# Patient Record
Sex: Male | Born: 1965 | Race: White | Hispanic: No | Marital: Married | Smoking: Never smoker
Health system: Southern US, Community
[De-identification: ages and names within clinical notes are randomized; demographics above are authoritative.]

## PROBLEM LIST (undated history)

## (undated) DIAGNOSIS — E785 Hyperlipidemia, unspecified: Secondary | ICD-10-CM

## (undated) DIAGNOSIS — J45909 Unspecified asthma, uncomplicated: Secondary | ICD-10-CM

## (undated) DIAGNOSIS — I10 Essential (primary) hypertension: Secondary | ICD-10-CM

## (undated) HISTORY — PX: MOUTH SURGERY: SHX715

## (undated) HISTORY — DX: Essential (primary) hypertension: I10

## (undated) HISTORY — DX: Hyperlipidemia, unspecified: E78.5

## (undated) HISTORY — DX: Unspecified asthma, uncomplicated: J45.909

---

## 2005-11-25 ENCOUNTER — Ambulatory Visit: Payer: Self-pay | Admitting: Internal Medicine

## 2005-12-07 ENCOUNTER — Ambulatory Visit: Payer: Self-pay

## 2005-12-08 ENCOUNTER — Ambulatory Visit: Payer: Self-pay | Admitting: Internal Medicine

## 2007-05-09 ENCOUNTER — Encounter: Payer: Self-pay | Admitting: Internal Medicine

## 2007-05-11 ENCOUNTER — Ambulatory Visit: Payer: Self-pay | Admitting: Internal Medicine

## 2007-05-11 DIAGNOSIS — R930 Abnormal findings on diagnostic imaging of skull and head, not elsewhere classified: Secondary | ICD-10-CM | POA: Insufficient documentation

## 2007-05-11 DIAGNOSIS — J45909 Unspecified asthma, uncomplicated: Secondary | ICD-10-CM | POA: Insufficient documentation

## 2007-05-12 ENCOUNTER — Encounter: Payer: Self-pay | Admitting: Internal Medicine

## 2007-05-12 LAB — CONVERTED CEMR LAB
ALT: 118 units/L — ABNORMAL HIGH (ref 0–53)
BUN: 15 mg/dL (ref 6–23)
Basophils Absolute: 0 10*3/uL (ref 0.0–0.1)
Bilirubin, Direct: 0.2 mg/dL (ref 0.0–0.3)
Creatinine, Ser: 1 mg/dL (ref 0.4–1.5)
Eosinophils Absolute: 0.1 10*3/uL (ref 0.0–0.6)
Eosinophils Relative: 1.3 % (ref 0.0–5.0)
HCT: 48 % (ref 39.0–52.0)
Hemoglobin: 17.2 g/dL — ABNORMAL HIGH (ref 13.0–17.0)
Lymphocytes Relative: 27.9 % (ref 12.0–46.0)
MCHC: 35.9 g/dL (ref 30.0–36.0)
MCV: 91.5 fL (ref 78.0–100.0)
Monocytes Absolute: 0.9 10*3/uL — ABNORMAL HIGH (ref 0.2–0.7)
Neutro Abs: 3.8 10*3/uL (ref 1.4–7.7)
Neutrophils Relative %: 57.3 % (ref 43.0–77.0)
Potassium: 3.1 meq/L — ABNORMAL LOW (ref 3.5–5.1)
Total Protein: 6.9 g/dL (ref 6.0–8.3)
WBC: 6.7 10*3/uL (ref 4.5–10.5)

## 2007-05-15 ENCOUNTER — Encounter (INDEPENDENT_AMBULATORY_CARE_PROVIDER_SITE_OTHER): Payer: Self-pay | Admitting: *Deleted

## 2007-05-17 ENCOUNTER — Encounter: Admission: RE | Admit: 2007-05-17 | Discharge: 2007-05-17 | Payer: Self-pay | Admitting: Internal Medicine

## 2007-05-19 ENCOUNTER — Encounter: Payer: Self-pay | Admitting: Internal Medicine

## 2007-05-22 ENCOUNTER — Encounter (INDEPENDENT_AMBULATORY_CARE_PROVIDER_SITE_OTHER): Payer: Self-pay | Admitting: *Deleted

## 2007-05-23 ENCOUNTER — Ambulatory Visit: Payer: Self-pay | Admitting: Cardiology

## 2007-05-25 ENCOUNTER — Encounter (INDEPENDENT_AMBULATORY_CARE_PROVIDER_SITE_OTHER): Payer: Self-pay | Admitting: *Deleted

## 2007-05-29 ENCOUNTER — Telehealth (INDEPENDENT_AMBULATORY_CARE_PROVIDER_SITE_OTHER): Payer: Self-pay | Admitting: *Deleted

## 2007-06-04 ENCOUNTER — Ambulatory Visit: Payer: Self-pay | Admitting: Internal Medicine

## 2007-06-04 DIAGNOSIS — E782 Mixed hyperlipidemia: Secondary | ICD-10-CM | POA: Insufficient documentation

## 2007-06-04 DIAGNOSIS — I10 Essential (primary) hypertension: Secondary | ICD-10-CM

## 2007-06-04 DIAGNOSIS — R740 Nonspecific elevation of levels of transaminase and lactic acid dehydrogenase [LDH]: Secondary | ICD-10-CM

## 2007-10-02 ENCOUNTER — Ambulatory Visit: Payer: Self-pay | Admitting: Internal Medicine

## 2007-10-08 LAB — CONVERTED CEMR LAB
ALT: 76 units/L — ABNORMAL HIGH (ref 0–53)
AST: 34 units/L (ref 0–37)
Potassium: 3.8 meq/L (ref 3.5–5.1)
Total Bilirubin: 0.8 mg/dL (ref 0.3–1.2)
Total Protein: 6.8 g/dL (ref 6.0–8.3)

## 2007-10-09 ENCOUNTER — Ambulatory Visit: Payer: Self-pay | Admitting: Internal Medicine

## 2008-01-31 ENCOUNTER — Ambulatory Visit: Payer: Self-pay | Admitting: Internal Medicine

## 2008-02-04 ENCOUNTER — Encounter (INDEPENDENT_AMBULATORY_CARE_PROVIDER_SITE_OTHER): Payer: Self-pay | Admitting: *Deleted

## 2008-02-04 LAB — CONVERTED CEMR LAB
AST: 36 units/L (ref 0–37)
Bilirubin, Direct: 0.1 mg/dL (ref 0.0–0.3)
Direct LDL: 76.4 mg/dL
Total Bilirubin: 0.9 mg/dL (ref 0.3–1.2)
Total CHOL/HDL Ratio: 5.3
Triglycerides: 209 mg/dL (ref 0–149)

## 2008-02-07 ENCOUNTER — Ambulatory Visit: Payer: Self-pay | Admitting: Internal Medicine

## 2008-02-19 ENCOUNTER — Telehealth (INDEPENDENT_AMBULATORY_CARE_PROVIDER_SITE_OTHER): Payer: Self-pay | Admitting: *Deleted

## 2008-06-02 ENCOUNTER — Ambulatory Visit: Payer: Self-pay | Admitting: Internal Medicine

## 2008-06-07 LAB — CONVERTED CEMR LAB
Albumin: 4.1 g/dL (ref 3.5–5.2)
Cholesterol: 164 mg/dL (ref 0–200)
HDL: 29.1 mg/dL — ABNORMAL LOW (ref >39.0)
Total CHOL/HDL Ratio: 5.6
Total Protein: 6.7 g/dL (ref 6.0–8.3)
VLDL: 43 mg/dL — ABNORMAL HIGH (ref 0–40)

## 2008-06-09 ENCOUNTER — Ambulatory Visit: Payer: Self-pay | Admitting: Internal Medicine

## 2008-06-09 DIAGNOSIS — K219 Gastro-esophageal reflux disease without esophagitis: Secondary | ICD-10-CM

## 2008-07-14 ENCOUNTER — Ambulatory Visit: Payer: Self-pay | Admitting: Family Medicine

## 2008-08-07 ENCOUNTER — Ambulatory Visit: Payer: Self-pay | Admitting: Internal Medicine

## 2008-08-11 ENCOUNTER — Ambulatory Visit: Payer: Self-pay | Admitting: Internal Medicine

## 2008-08-12 ENCOUNTER — Telehealth (INDEPENDENT_AMBULATORY_CARE_PROVIDER_SITE_OTHER): Payer: Self-pay | Admitting: *Deleted

## 2008-08-12 ENCOUNTER — Encounter (INDEPENDENT_AMBULATORY_CARE_PROVIDER_SITE_OTHER): Payer: Self-pay | Admitting: *Deleted

## 2008-10-09 ENCOUNTER — Ambulatory Visit: Payer: Self-pay | Admitting: Internal Medicine

## 2008-10-12 LAB — CONVERTED CEMR LAB
Bilirubin, Direct: 0.1 mg/dL (ref 0.0–0.3)
Total Bilirubin: 1 mg/dL (ref 0.3–1.2)

## 2008-10-14 ENCOUNTER — Encounter: Payer: Self-pay | Admitting: Internal Medicine

## 2008-10-14 ENCOUNTER — Telehealth (INDEPENDENT_AMBULATORY_CARE_PROVIDER_SITE_OTHER): Payer: Self-pay | Admitting: *Deleted

## 2008-10-23 ENCOUNTER — Encounter (INDEPENDENT_AMBULATORY_CARE_PROVIDER_SITE_OTHER): Payer: Self-pay | Admitting: *Deleted

## 2008-10-29 ENCOUNTER — Ambulatory Visit: Payer: Self-pay | Admitting: Internal Medicine

## 2008-10-29 DIAGNOSIS — E8881 Metabolic syndrome: Secondary | ICD-10-CM

## 2008-12-10 ENCOUNTER — Ambulatory Visit: Payer: Self-pay | Admitting: Internal Medicine

## 2008-12-25 ENCOUNTER — Ambulatory Visit: Payer: Self-pay | Admitting: Internal Medicine

## 2009-01-15 ENCOUNTER — Telehealth (INDEPENDENT_AMBULATORY_CARE_PROVIDER_SITE_OTHER): Payer: Self-pay | Admitting: *Deleted

## 2009-03-13 ENCOUNTER — Ambulatory Visit: Payer: Self-pay | Admitting: Internal Medicine

## 2009-03-15 LAB — CONVERTED CEMR LAB
Albumin: 4.3 g/dL (ref 3.5–5.2)
Cholesterol: 162 mg/dL (ref 0–200)
Direct LDL: 96.1 mg/dL
HDL: 31.1 mg/dL — ABNORMAL LOW (ref >39.00)
Hgb A1c MFr Bld: 5.2 % (ref 4.6–6.5)
Total CHOL/HDL Ratio: 5
Triglycerides: 203 mg/dL — ABNORMAL HIGH (ref 0.0–149.0)
VLDL: 40.6 mg/dL — ABNORMAL HIGH (ref 0.0–40.0)

## 2009-03-16 ENCOUNTER — Encounter (INDEPENDENT_AMBULATORY_CARE_PROVIDER_SITE_OTHER): Payer: Self-pay | Admitting: *Deleted

## 2009-03-28 IMAGING — CT CT CHEST W/O CM
2 of 4 series · 15 of 36 positions shown, 18 images · non-contrast
Comparison: Chest x-ray from 05/17/2007

CLINICAL DATA: Question right lung nodule on recent chest x-ray
TECHNIQUE: Routine uninfused CT scanning of the chest was performed at 5mm
collimation.

[Series 2: chest_hi_res 5.0 b40f st · axial · 0.87mm/px · z∈[-318,-44]mm · 12 of 67 slices shown, 15 images]
[im 6/67  mediastinal]
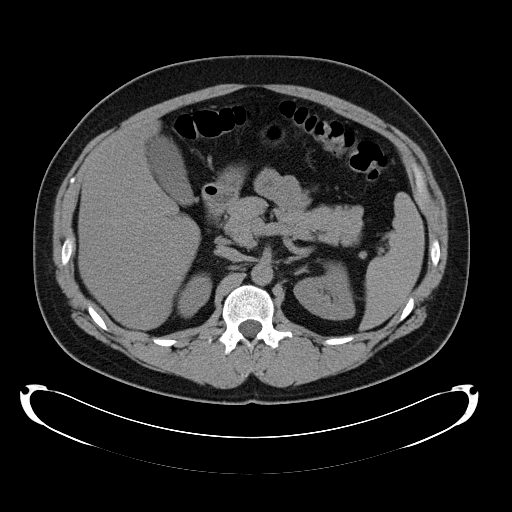
[im 6/67  lung]
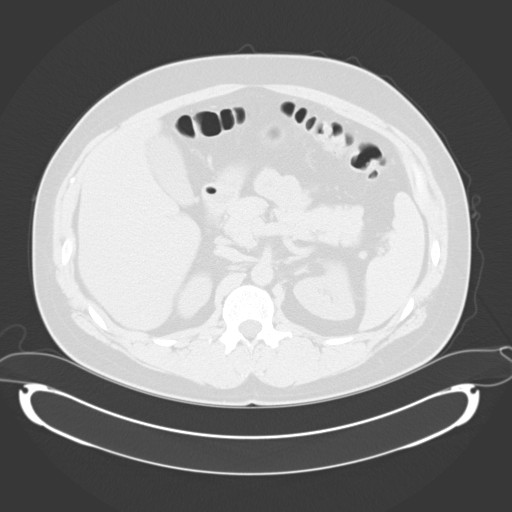
[im 11/67  lung]
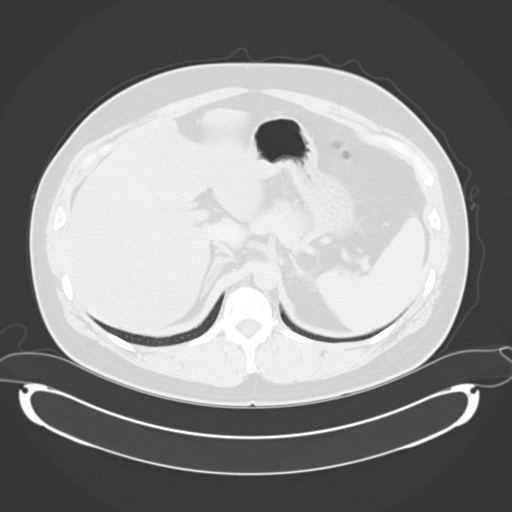
[im 16/67  lung]
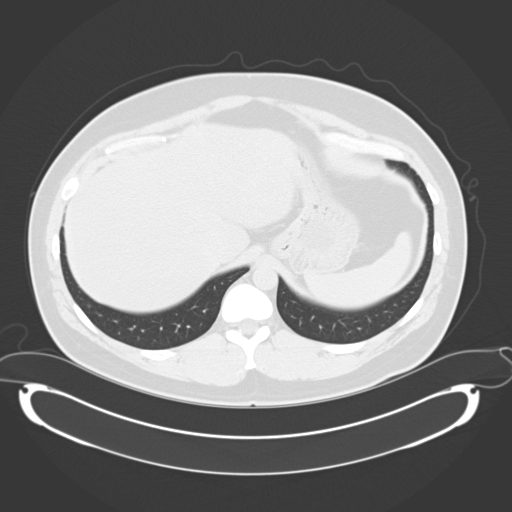
[im 21/67  lung]
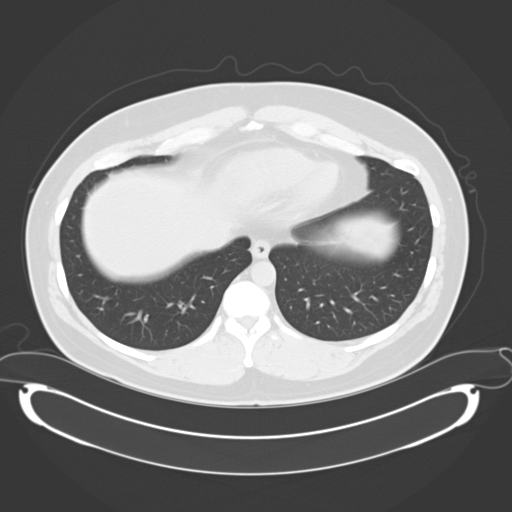
[im 26/67  mediastinal]
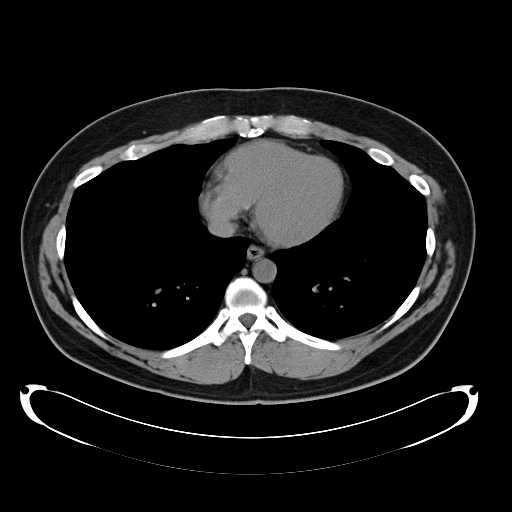
[im 26/67  lung]
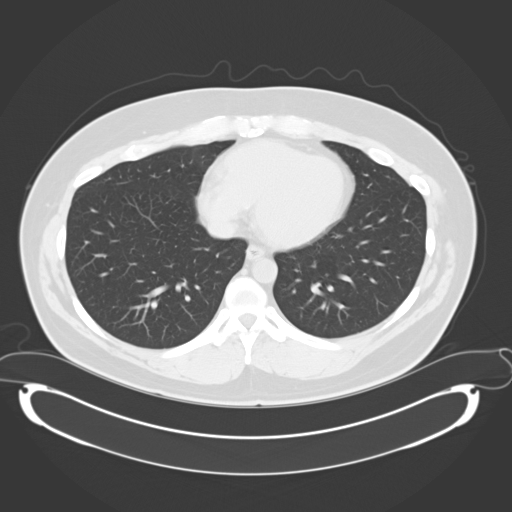
[im 31/67  lung]
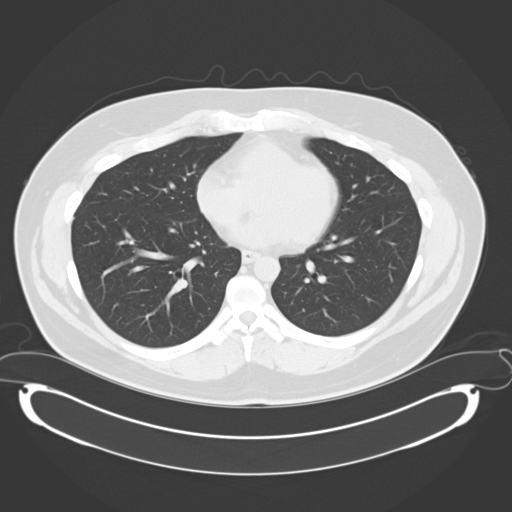
[im 36/67  lung]
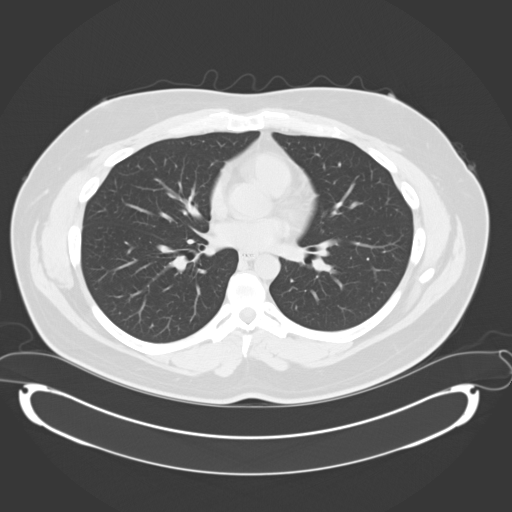
[im 41/67  lung]
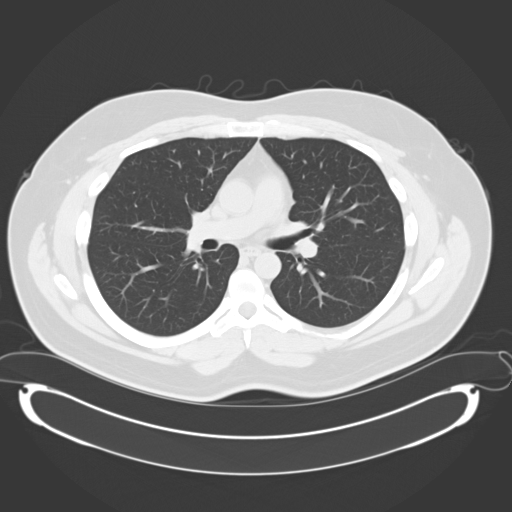
[im 46/67  mediastinal]
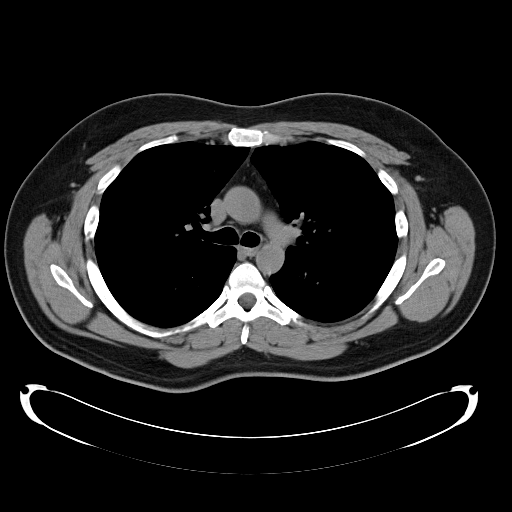
[im 46/67  lung]
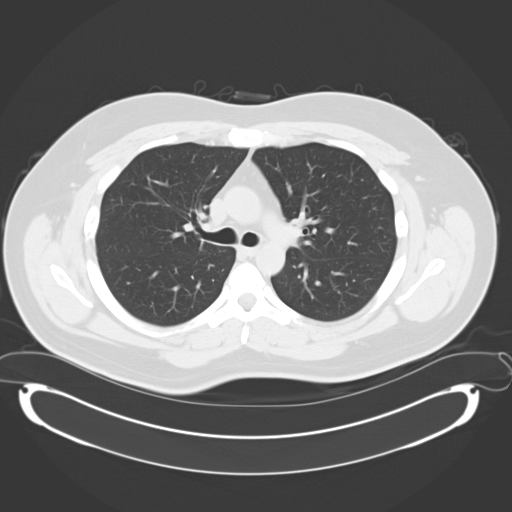
[im 51/67  lung]
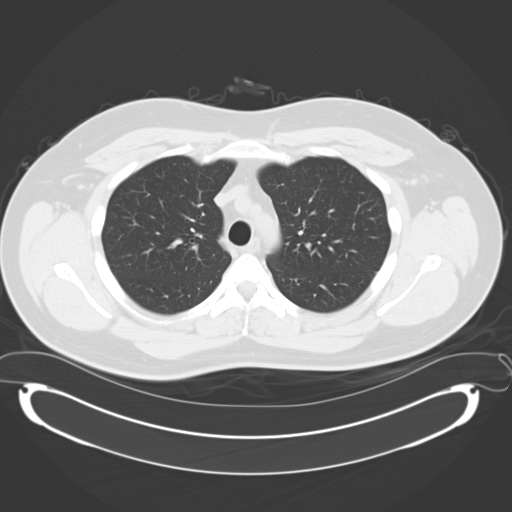
[im 56/67  lung]
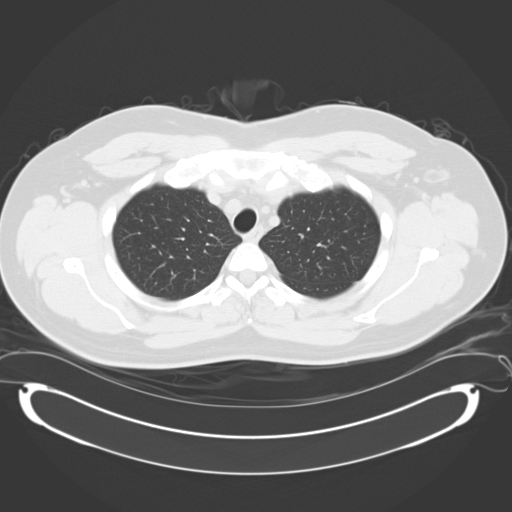
[im 61/67  lung]
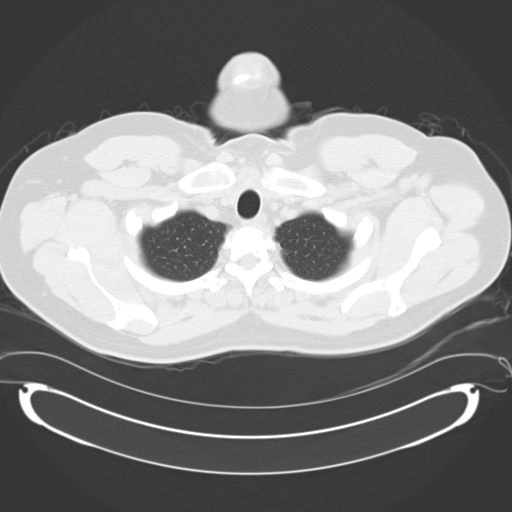

[Series 602: <mpr thick range> · coronal · 0.87mm/px · 3 of 87 slices shown]
[im 18/87  lung]
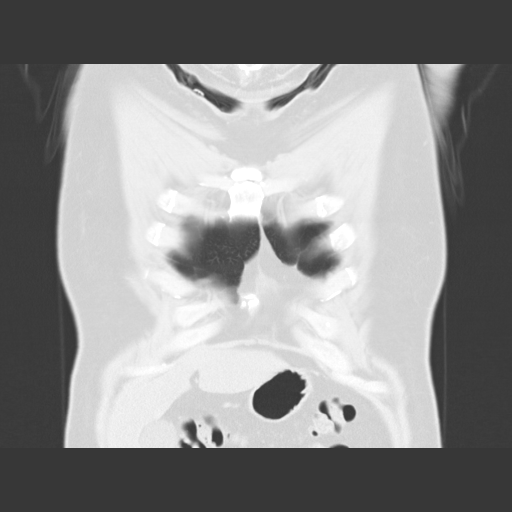
[im 35/87  lung]
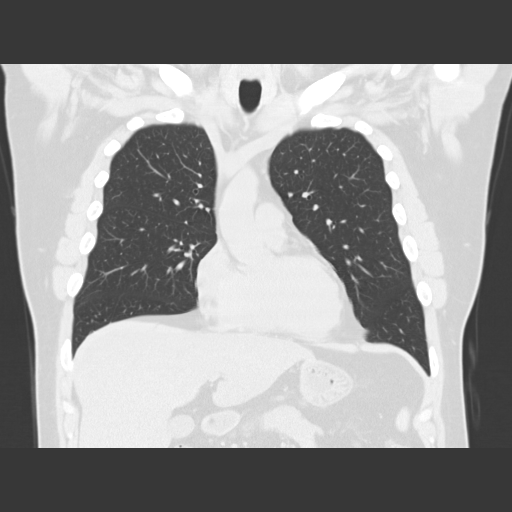
[im 52/87  lung]
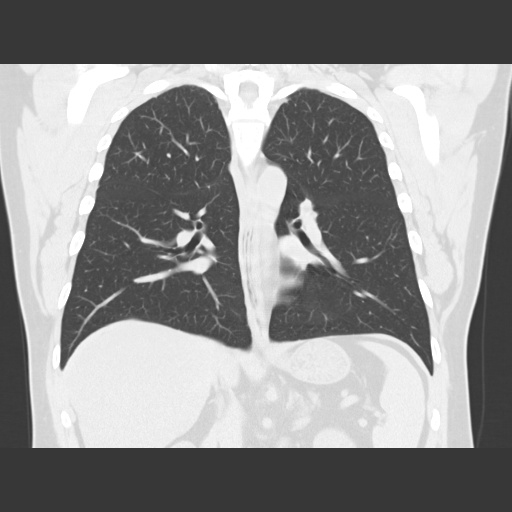

[15 of 36 positions shown; findings below may reference images not displayed]

CHEST CT WITHOUT CONTRAST:

There is no axillary, mediastinal, or hilar lymphadenopathy. Heart is normal. No
pericardial or pleural effusion. The lungs are clear bilaterally. Specifically,
there is no evidence for a nodule in the infrahilar region of the right lung.

Imaging of the upper abdomen show some fatty infiltration of the liver.
IMPRESSION: Unremarkable CT examination of the chest.

## 2010-05-19 ENCOUNTER — Encounter: Payer: Self-pay | Admitting: Internal Medicine

## 2010-05-19 ENCOUNTER — Ambulatory Visit
Admission: RE | Admit: 2010-05-19 | Discharge: 2010-05-19 | Payer: Self-pay | Source: Home / Self Care | Attending: Internal Medicine | Admitting: Internal Medicine

## 2010-05-19 DIAGNOSIS — M545 Low back pain: Secondary | ICD-10-CM | POA: Insufficient documentation

## 2010-05-27 NOTE — Assessment & Plan Note (Signed)
Summary: med refill//lch   Vital Signs:  Patient profile:   45 year old male Height:      70.25 inches Weight:      236.6 pounds BMI:     33.83 Temp:     98.2 degrees F oral Pulse rate:   72 / minute Resp:     16 per minute BP sitting:   140 / 98  (left arm) Cuff size:   large  Vitals Entered By: Shonna Chock CMA (May 19, 2010 2:08 PM) CC: Yearly CPX and refill meds , Back pain, Lipid Management   CC:  Yearly CPX and refill meds , Back pain, and Lipid Management.  History of Present Illness:    Dustin Wang  ([name]) is here for a physical; he has intermittent low back pain since fall on ice  in 2010. The pain is sharp & nonradiating.  The patient denies associated  weakness, loss of sensation, fecal incontinence, urinary incontinence, and urinary retention.   The pain is made worse by "plopping " onto hard surface.     Hypertension Follow-Up:  The patient denies lightheadedness, urinary frequency, headaches, edema, and fatigue.  The patient denies the following associated symptoms: chest pain, chest pressure, exercise intolerance, dyspnea, palpitations, and syncope.  Compliance with medications (by patient report) has been near 100%.  The patient reports that dietary compliance has been fair.  The patient reports exercising occasionally.  Adjunctive measures currently used by the patient include salt restriction.  BP not monitored.  Lipid Management History:      Positive NCEP/ATP III risk factors include HDL cholesterol less than 40 and hypertension.  Negative NCEP/ATP III risk factors include male age less than 57 years old, non-diabetic, no family history for ischemic heart disease, non-tobacco-user status, no ASHD (atherosclerotic heart disease), no prior stroke/TIA, no peripheral vascular disease, and no history of aortic aneurysm.     Preventive Screening-Counseling & Management  Alcohol-Tobacco     Smoking Status: never  Current Medications (verified): 1)  Vit B 2)  Fish Oil  Qd 3)  Vit C Qd 4)  Diltiazem Hcl Coated Beads 120 Mg  Cp24 (Diltiazem Hcl Coated Beads) .Marland Kitchen.. 1 By Mouth Once Daily **appointment Due** 5)  Buspirone Hcl 10 Mg Tabs (Buspirone Hcl) .... 1/2 By Mouth Once Daily 6)  Vitamin D 1000 Unit Tabs (Cholecalciferol) .Marland Kitchen.. 1 By Mouth Once Daily 7)  Metrogel 1 % Gel (Metronidazole) .... Apply Once Daily After Cleansing  Allergies: 1)  ! Pcn  Past History:  Past Medical History: Hypertension Hyperlipidemia: Framingham Study LDL = < 130. 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; Metabolic Syndrome( TG 208, HDL 31, VLDL  42); neg Nuclear Stress Test 2007  Past Surgical History: Oral surgery ([place] Teeth)  Family History: Father: HTN Mother: chronic  bronchitis,HTN Siblings: negative ; MGGM :CVAs; no FH MI No FH PULMONARY  DISEASE  Social History: Occupation: Transport planner Married Never Smoked Alcohol use-yes: rarely  Review of Systems  The patient denies anorexia, fever, vision loss, decreased hearing, hoarseness, prolonged cough, hemoptysis, abdominal pain, melena, hematochezia, severe indigestion/heartburn, hematuria, suspicious skin lesions, depression, unusual weight change, abnormal bleeding, enlarged lymph nodes, and angioedema.         Weight up 5#.  Physical Exam  General:  well-nourished;alert,appropriate and cooperative throughout examination Head:  Normocephalic and atraumatic without obvious abnormalities. No  alopecia  Eyes:  No corneal or conjunctival inflammation noted.Perrla. Funduscopic exam benign, without hemorrhages, exudates or papilledema.  Ears:  External ear exam shows  no significant lesions or deformities.  Otoscopic examination reveals clear canals, tympanic membranes are intact bilaterally without bulging, retraction, inflammation or discharge. Hearing is grossly normal bilaterally. Nose:  External nasal examination shows no deformity or inflammation. Nasal mucosa are pink and moist without lesions or exudates. Mouth:  Oral  mucosa and oropharynx without lesions or exudates.  Teeth in good repair. Neck:  No deformities, masses, or tenderness noted. Lungs:  Normal respiratory effort, chest expands symmetrically. Lungs are clear to auscultation, no crackles or wheezes. Heart:  Normal rate and regular rhythm. S1 and S2 normal without gallop, murmur, click, rub . S4 Abdomen:  Bowel sounds positive,abdomen soft and non-tender without masses, organomegaly or hernias noted. Rectal:  No external abnormalities noted. Normal sphincter tone. No rectal masses or tenderness. Genitalia:  Testes bilaterally descended without nodularity, tenderness or masses. No scrotal masses or lesions. No penis lesions or urethral discharge. L varicocele.   Prostate:  Prostate gland firm and smooth, no enlargement, nodularity, tenderness, mass, asymmetry or induration. Msk:  No deformity or scoliosis noted of thoracic or lumbar spine.   Neg SLR  Pulses:  R and L carotid,radial,dorsalis pedis and posterior tibial pulses are full and equal bilaterally Extremities:  No clubbing, cyanosis, edema, or deformity noted with normal full range of motion of all joints.   Neurologic:  alert & oriented X3, strength normal in all extremities, and DTRs symmetrical and normal.   Skin:  Skin tags in  R axilla Cervical Nodes:  No lymphadenopathy noted Axillary Nodes:  No palpable lymphadenopathy Inguinal Nodes:  No significant adenopathy Psych:  memory intact for recent and remote, normally interactive, and good eye contact.     Impression & Recommendations:  Problem # 1:  ROUTINE GENERAL MEDICAL EXAM@HEALTH  CARE FACL (ICD-V70.0)  Orders: EKG w/ Interpretation (93000)  Problem # 2:  LOW BACK PAIN, CHRONIC (ICD-724.2) sacral injury post fall  Problem # 3:  HYPERTENSION, ESSENTIAL NOS (ICD-401.9)  His updated medication list for this problem includes:    Diltiazem Hcl Coated Beads 120 Mg Cp24 (Diltiazem hcl coated beads) .Marland Kitchen... 1 by mouth once daily     Losartan Potassium 100 Mg Tabs (Losartan potassium) .Marland Kitchen... 1/2 -1 once daily if bp stays > 135/85 on average   Problem # 4:  HYPERLIPIDEMIA (ICD-272.2)  Complete Medication List: 1)  Vit B  2)  Fish Oil Qd  3)  Vit C Qd  4)  Diltiazem Hcl Coated Beads 120 Mg Cp24 (Diltiazem hcl coated beads) .Marland Kitchen.. 1 by mouth once daily 5)  Buspirone Hcl 10 Mg Tabs (Buspirone hcl) .... 1/2 by mouth once daily 6)  Vitamin D 1000 Unit Tabs (Cholecalciferol) .Marland Kitchen.. 1 by mouth once daily 7)  Metrogel 1 % Gel (Metronidazole) .... Apply once daily after cleansing 8)  Losartan Potassium 100 Mg Tabs (Losartan potassium) .... 1/2 -1 once daily if bp stays > 135/85 on average  Lipid Assessment/Plan:      Based on NCEP/ATP III, the patient's risk factor category is "2 or more risk factors and a calculated 10 year CAD risk of > 20%".  The patient's lipid goals are as follows: Total cholesterol goal is 200; LDL cholesterol goal is 160; HDL cholesterol goal is 40; Triglyceride goal is 150.  His LDL cholesterol goal has been met.  Secondary causes for hyperlipidemia have been ruled out.  He has been counseled on adjunctive measures for lowering his cholesterol and has been provided with dietary instructions.    Patient Instructions: 1)  Consume < 40 grams of HFCS sugar / day as discussed.Consider fasting labs; see Diagnoses for Codes: 2)  BMP ; 3)  Hepatic Panel ; 4)  Lipid Panel ; 5)  TSH ; 6)  CBC w/ Diff . 7)  Check your Blood Pressure regularly. If it is above:  135/85 ON AVERAGE you should  add Losartan. Prescriptions: LOSARTAN POTASSIUM 100 MG TABS (LOSARTAN POTASSIUM) 1/2 -1 once daily if BP stays > 135/85 on average  #30 x 5   Entered and Authorized by:   Marga Melnick MD   Signed by:   Marga Melnick MD on 05/19/2010   Method used:   Print then Give to Patient   RxID:   [id number] DILTIAZEM HCL COATED BEADS 120 MG  CP24 (DILTIAZEM HCL COATED BEADS) 1 by mouth once daily  #90 x 3   Entered and Authorized  by:   Marga Melnick MD   Signed by:   Marga Melnick MD on 05/19/2010   Method used:   Print then Give to Patient   RxID:   [id number]    Orders Added: 1)  Est. Patient 40-64 years [99396] 2)  EKG w/ Interpretation [93000]

## 2010-06-17 IMAGING — CR DG CHEST 2V
2 series · 2 of 2 positions shown · non-contrast
Comparison: 05/17/2007

CLINICAL DATA: Cough/left chest pain

CHEST - 2 VIEW

[view not recorded (1 of 2)]
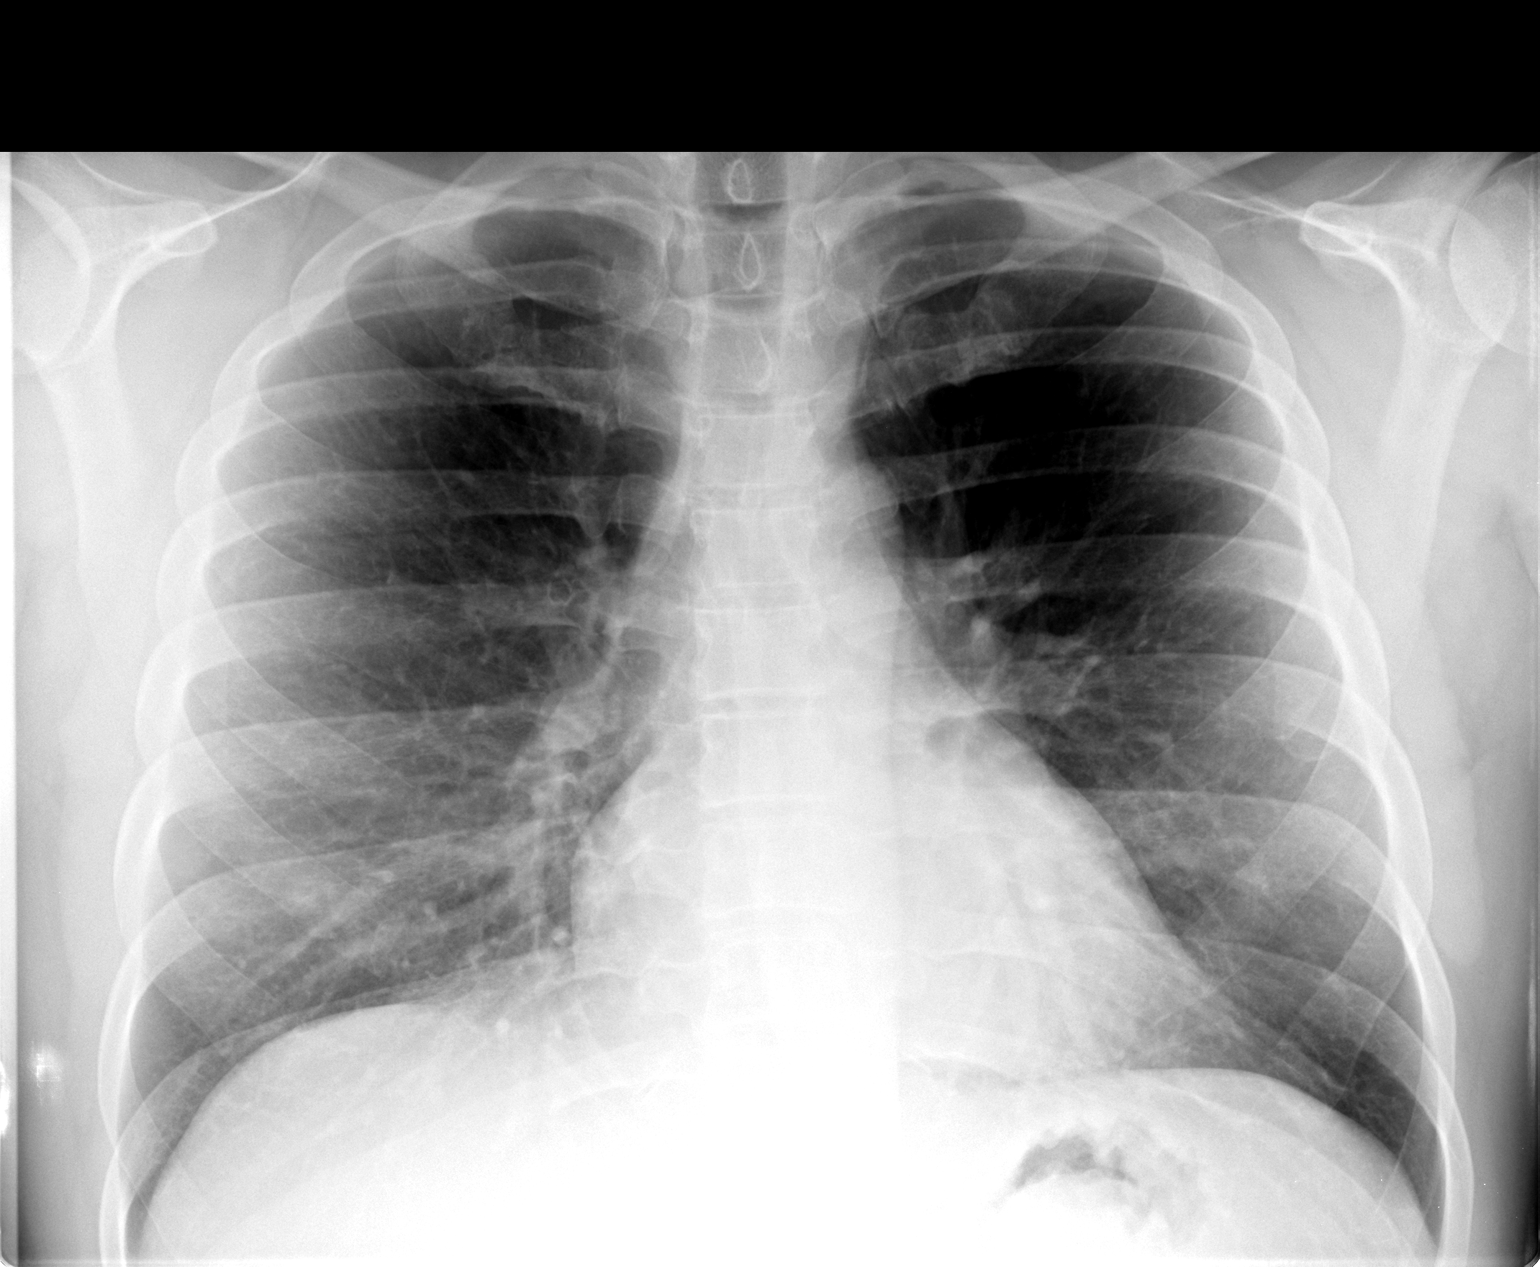

[view not recorded (2 of 2)]
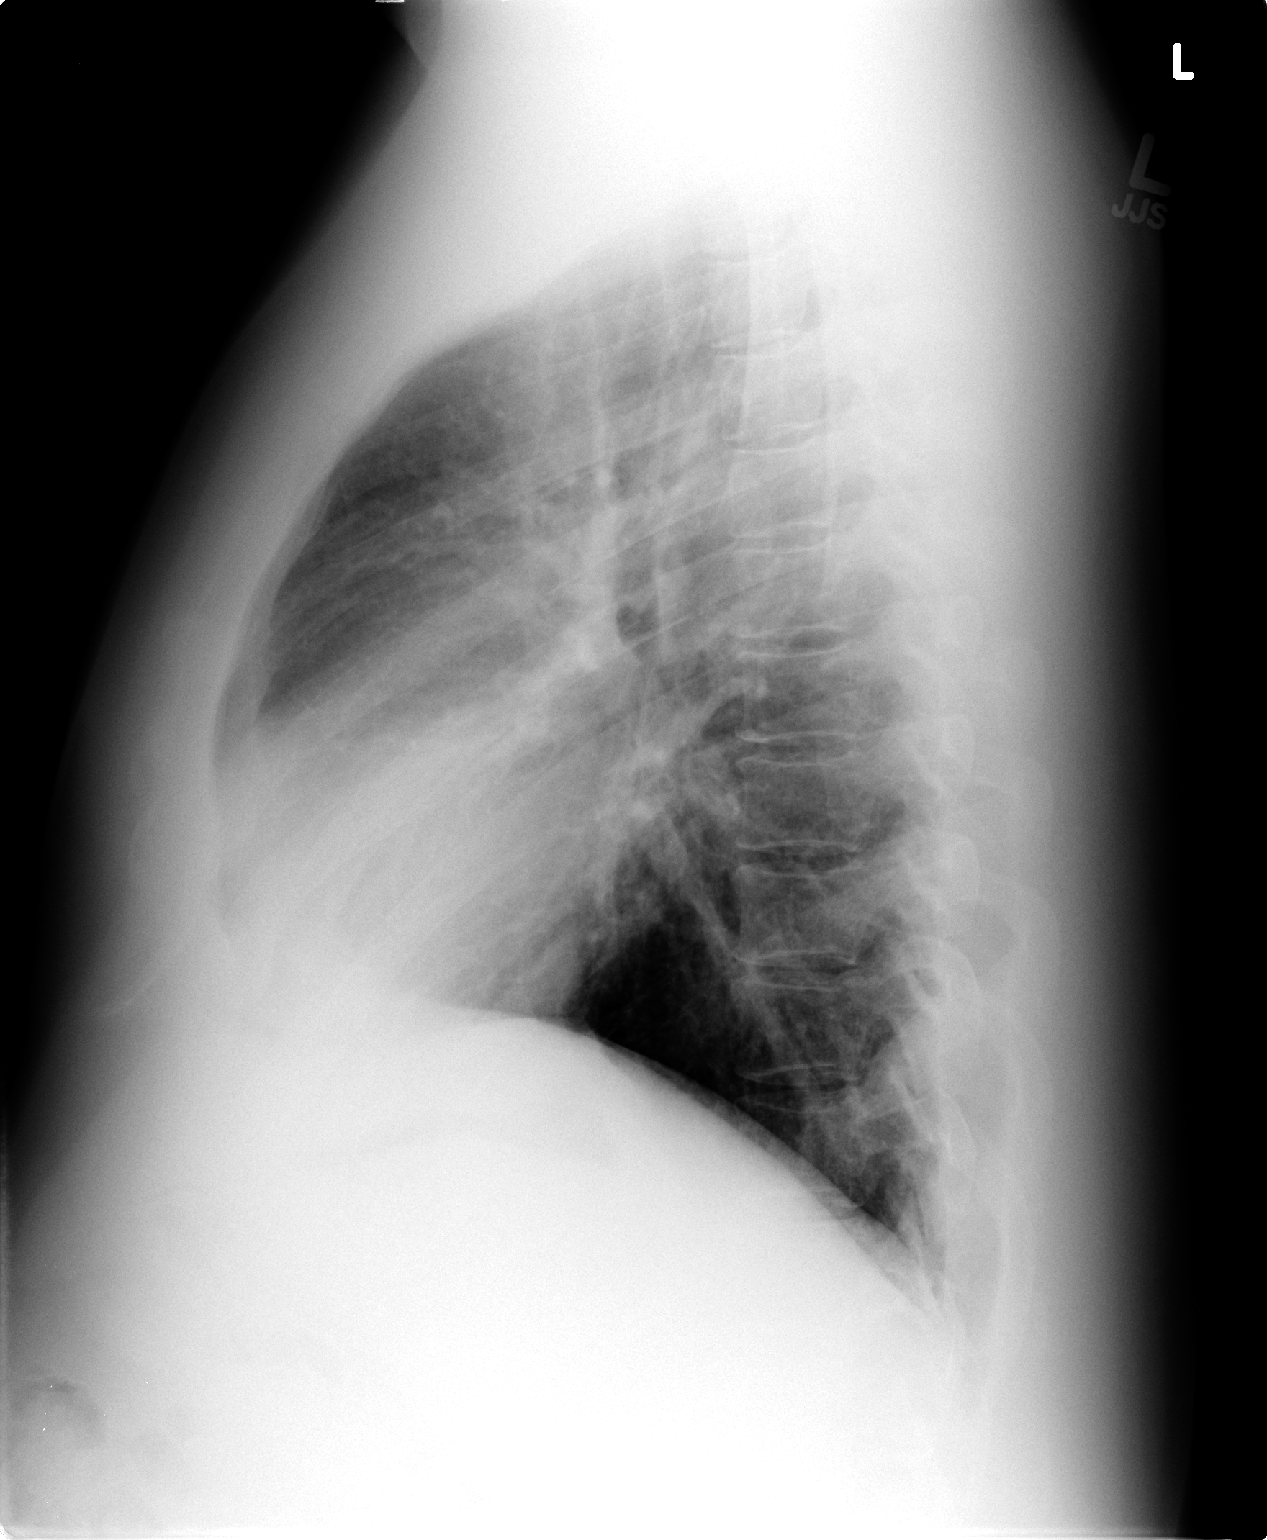

[2 of 2 positions shown; findings below may reference images not displayed]

FINDINGS: Heart size within normal limits.  Lungs clear.  No
pleural fluid or osseous lesions.  There is mild peribronchial
thickening, and the lungs appear mildly hyperaerated.
IMPRESSION: Chronic changes as above.  No active disease.

## 2010-07-13 ENCOUNTER — Other Ambulatory Visit (INDEPENDENT_AMBULATORY_CARE_PROVIDER_SITE_OTHER): Payer: 59

## 2010-07-13 ENCOUNTER — Telehealth: Payer: Self-pay | Admitting: Internal Medicine

## 2010-07-13 DIAGNOSIS — E782 Mixed hyperlipidemia: Secondary | ICD-10-CM

## 2010-07-13 DIAGNOSIS — I10 Essential (primary) hypertension: Secondary | ICD-10-CM

## 2010-07-13 DIAGNOSIS — Z0000 Encounter for general adult medical examination without abnormal findings: Secondary | ICD-10-CM

## 2010-07-13 DIAGNOSIS — M545 Low back pain: Secondary | ICD-10-CM

## 2010-07-13 LAB — LIPID PANEL
Cholesterol: 174 mg/dL (ref 0–200)
Triglycerides: 409 mg/dL — ABNORMAL HIGH (ref 0.0–149.0)

## 2010-07-13 LAB — BASIC METABOLIC PANEL
CO2: 27 mEq/L (ref 19–32)
Calcium: 9.2 mg/dL (ref 8.4–10.5)
Creatinine, Ser: 1 mg/dL (ref 0.4–1.5)

## 2010-07-13 LAB — HEPATIC FUNCTION PANEL
AST: 43 U/L — ABNORMAL HIGH (ref 0–37)
Albumin: 4.4 g/dL (ref 3.5–5.2)
Alkaline Phosphatase: 83 U/L (ref 39–117)
Total Protein: 7 g/dL (ref 6.0–8.3)

## 2010-07-13 LAB — TSH: TSH: 0.91 u[IU]/mL (ref 0.35–5.50)

## 2010-07-13 NOTE — Telephone Encounter (Signed)
error 

## 2010-07-27 ENCOUNTER — Ambulatory Visit (INDEPENDENT_AMBULATORY_CARE_PROVIDER_SITE_OTHER): Payer: 59 | Admitting: Internal Medicine

## 2010-07-27 ENCOUNTER — Encounter: Payer: Self-pay | Admitting: Internal Medicine

## 2010-07-27 DIAGNOSIS — F411 Generalized anxiety disorder: Secondary | ICD-10-CM

## 2010-07-27 DIAGNOSIS — E782 Mixed hyperlipidemia: Secondary | ICD-10-CM

## 2010-07-27 DIAGNOSIS — I10 Essential (primary) hypertension: Secondary | ICD-10-CM

## 2010-07-27 DIAGNOSIS — E8881 Metabolic syndrome: Secondary | ICD-10-CM

## 2010-07-27 MED ORDER — BUPROPION HCL 75 MG PO TABS: 75.0000 mg | ORAL_TABLET | Freq: Two times a day (BID) | ORAL | Status: DC

## 2010-07-27 MED ORDER — LOSARTAN POTASSIUM 100 MG PO TABS: 100.0000 mg | ORAL_TABLET | Freq: Every day | ORAL | Status: DC

## 2010-07-27 NOTE — Progress Notes (Signed)
  Subjective:    Patient ID: Dustin Wang, male    DOB: 1965-05-30, 45 y.o.   MRN: [id number]  HPI He returns for followup of his lab results. The triglycerides are now 409, HDL 32, VLDL 82. LDL is only 92. The pathophysiology of triglycerides  elevation and its possible complications ( Metabolic Syndrome/ pre Diabetes, pancreatitis, fatty liver) were discussed. Sources of sugar for hydrocodone syrup were reviewed.  He has not been exercising on a regular basis. His new job is associated with increased travel and stress. He describes difficulty focusing & some depression.Buspirone 10 mg was not effective.  He is concerned because his blood pressures in the 150s over 90s. He describes some lightheadedness and intermittent tachycardia. He denies chest pain, palpitations, dyspnea, or edema. He never filled Rx for Losartan.   Review of Systems     Objective:   Physical Exam Eye - Pupils Equal Round Reactive to light. Fundi without hemorrhage or visible lesions, Conjunctiva without redness or discharge  Heart:  Normal rate and regular rhythm. S1 and S2 normal without gallop, murmur, click, rub or other extra sounds.                                                                                                      Lungs:Chest clear to auscultation; no wheezes, rhonchi,rales ,or rubs present.No increased work of breathing. All pulses intact w/o bruits.Abdomen: bowel sounds normal, soft and non-tender without masses, organomegaly or hernias noted.  No guarding or rebound . No AAA or bruits.Psych:  Cognition and judgment appear intact. Alert, communicative  and cooperative with normal attention span and concentration.  Assessment & Plan:  #1Metabolic Syndrome with  prediabetes  #2 hypertension suboptimally controlled  #3 elevated hepatic enzymes due to fatty liver  #4 exogenous stress.  Plan: #1 see new medications.  #2 followup fasting hepatic profile, lipid profile , A1c with BUN, creatinine  and potassium in 10 weeks.

## 2010-07-27 NOTE — Patient Instructions (Signed)
Fasting labs in 10 weeks: Lipid panel, hepatic panel, A1c, BUN, creatinine, and potassium. (277.7, 401.9, 790.4). The most common cause of elevated triglycerides is the ingestion of sugar from high fructose corn syrup sources. You should consume less than 40 grams  of sugar per day from foods and drinks with high fructose corn syrup as number 2, 3, or #4 on the label. Preventive Health Care: Exercise at least 30-45 minutes a day,  3-4 days a week.  Eat a low-fat diet with lots of fruits and vegetables, up to 7-9 servings per day. Avoid obesity; your goal is waist measurement < 40 inches.Consume less than 40 grams of sugar per day from foods & drinks with High Fructose Corn Sugar as #2,3 or # 4 on label. Seatbelts can save your life. Wear them always. Smoke detectors on every level of your home, check batteries every year. Eye Doctor - have an eye exam @ least annually.                                                        Safe sex - if you may be exposed to STDs, use a condom. Alcohol If you drink, do it moderately,less than 9 drinks per week, preferably less than 6 @ most. Depression is common in our stressful world.If you're feeling down or losing interest in things you normally enjoy, please call . All these guidelines as

## 2010-07-28 ENCOUNTER — Other Ambulatory Visit: Payer: Self-pay | Admitting: Internal Medicine

## 2010-10-04 ENCOUNTER — Other Ambulatory Visit: Payer: Self-pay | Admitting: Internal Medicine

## 2010-10-04 DIAGNOSIS — E8881 Metabolic syndrome: Secondary | ICD-10-CM

## 2010-10-04 DIAGNOSIS — I10 Essential (primary) hypertension: Secondary | ICD-10-CM

## 2010-10-05 ENCOUNTER — Other Ambulatory Visit (INDEPENDENT_AMBULATORY_CARE_PROVIDER_SITE_OTHER): Payer: 59

## 2010-10-05 DIAGNOSIS — I10 Essential (primary) hypertension: Secondary | ICD-10-CM

## 2010-10-05 DIAGNOSIS — E8881 Metabolic syndrome: Secondary | ICD-10-CM

## 2010-10-05 LAB — LIPID PANEL
Cholesterol: 180 mg/dL (ref 0–200)
LDL Cholesterol: 113 mg/dL — ABNORMAL HIGH (ref 0–99)

## 2010-10-05 LAB — HEPATIC FUNCTION PANEL
Alkaline Phosphatase: 82 U/L (ref 39–117)
Bilirubin, Direct: 0.1 mg/dL (ref 0.0–0.3)
Total Protein: 7.5 g/dL (ref 6.0–8.3)

## 2010-10-05 LAB — CREATININE, SERUM: Creatinine, Ser: 1 mg/dL (ref 0.4–1.5)

## 2010-10-05 NOTE — Progress Notes (Signed)
Labs only

## 2010-10-13 ENCOUNTER — Ambulatory Visit (INDEPENDENT_AMBULATORY_CARE_PROVIDER_SITE_OTHER): Payer: 59 | Admitting: Internal Medicine

## 2010-10-13 ENCOUNTER — Encounter: Payer: Self-pay | Admitting: Internal Medicine

## 2010-10-13 DIAGNOSIS — E782 Mixed hyperlipidemia: Secondary | ICD-10-CM

## 2010-10-13 DIAGNOSIS — K219 Gastro-esophageal reflux disease without esophagitis: Secondary | ICD-10-CM

## 2010-10-13 DIAGNOSIS — R05 Cough: Secondary | ICD-10-CM

## 2010-10-13 DIAGNOSIS — E8881 Metabolic syndrome: Secondary | ICD-10-CM

## 2010-10-13 MED ORDER — RANITIDINE HCL 150 MG PO TABS: 150.0000 mg | ORAL_TABLET | Freq: Two times a day (BID) | ORAL | Status: DC

## 2010-10-13 NOTE — Progress Notes (Signed)
  Subjective:    Patient ID: Dustin Wang, male    DOB: 07/10/1965, 45 y.o.   MRN: [id number]  HPI #1 Cough Onset:2-3 weeks Extrinsic symptoms:itchy eyes, sneezing:some, greater than normal  Infectious symptoms :fever, purulent secretions :no Chest symptoms: pain, sputum production, hemoptysis,dyspnea,wheezing:no GI symptoms: Dyspepsia, reflux:no but cough occurs < 5 min after eating Occupational/environmental exposures:no Smoking:never ACE inhibitor:no Treatment/efficacy:none Past medical history/family history pulmonary disease: PMH of bronchitis with some bronchospasm #2 Dyslipidemia  Family history of premature CAD/ MI: no .  Nutrition: avoids sodas & sweets, low carb .  Exercise: yard work . Diabetes : no . HTN: controlled..   Weight :  stable. ROS: fatigue: no ; chest pain : no ;claudication: no; palpitations: no; abd pain/bowel changes: no but increased gas ; myalgias:no;  syncope : no ; memory loss: no;skin changes: no. Lab results reviewed :dramatic drop in TG & LFTs.    Review of Systems The major and minor symptoms of rhinosinusitis were reviewed. He denies nasal congestion/obstruction; nasal purulence; facial pain; anosmia; fever; headache;  earache and dental pain. .     Objective:   Physical Exam General appearance is of good health and nourishment; no acute distress or increased work of breathing is present.  No  lymphadenopathy about the head, neck, or axilla noted.   Eyes: No conjunctival inflammation or lid edema is present. There is no scleral icterus.  Ears:  External ear exam shows no significant lesions or deformities.  Otoscopic examination reveals clear canals, tympanic membranes are intact bilaterally without bulging, retraction, inflammation or discharge.  Nose:  External nasal examination shows no deformity or inflammation. Nasal mucosa are pink and moist without lesions or exudates. No septal dislocation or dislocation.No obstruction to airflow.    Oral exam: Dental hygiene is good; lips and gums are healthy appearing.There is no oropharyngeal erythema or exudate noted.   Neck:  No deformities, thyromegaly, masses, or tenderness noted.   Supple with full range of motion without pain.   Heart:  Normal rate and regular rhythm. S1 and S2 normal without gallop, murmur, click, rub or other extra sounds.   Lungs:Chest clear to auscultation; no wheezes, rhonchi,rales ,or rubs present.No increased work of breathing.    Extremities:  No cyanosis, edema, or clubbing  noted  Vasc: all pulses intact   Skin: Warm & dry w/o jaundice or tenting.          Assessment & Plan:  #1 cough; probably due to GERD #2 see Problem List & Orders

## 2010-10-13 NOTE — Patient Instructions (Addendum)
The most common cause of elevated triglycerides is the ingestion of sugar from high fructose corn syrup sources. You should consume less than 40 grams  of sugar per day from foods and drinks with high fructose corn syrup as number 2, 3, or #4 on the label. As TG go up, HDL or good cholesterol goes down. Also uric acid which causes gout will go up. The triggers for dyspepsia or "heart burn"  include stress; the "aspirin family" ; alcohol; peppermint; and caffeine (coffee, tea, cola, and chocolate). The aspirin family would include aspirin and the nonsteroidal agents such as ibuprofen &  Naproxen. Tylenol would not cause reflux. If having dyspepsia ; food & dink should be avoided for @ least 2 hors before going to bed.  Exercise at least 30-45 minutes a day,  3-4 days a week.  Eat a low-fat diet with lots of fruits and vegetables, up to 7-9 servings per day. Avoid obesity; your goal is waist measurement < 40 inches. Please  schedule fasting Labs in 6 mos : BMET,Lipids, hepatic panel

## 2011-02-28 ENCOUNTER — Encounter: Payer: Self-pay | Admitting: Internal Medicine

## 2011-03-01 ENCOUNTER — Ambulatory Visit (INDEPENDENT_AMBULATORY_CARE_PROVIDER_SITE_OTHER): Payer: 59 | Admitting: Internal Medicine

## 2011-03-01 VITALS — BP 138/82 | HR 78 | Temp 98.4°F | Ht 70.5 in | Wt 236.0 lb

## 2011-03-01 DIAGNOSIS — H109 Unspecified conjunctivitis: Secondary | ICD-10-CM

## 2011-03-01 MED ORDER — ERYTHROMYCIN 5 MG/GM OP OINT: TOPICAL_OINTMENT | OPHTHALMIC | Status: AC

## 2011-03-01 NOTE — Progress Notes (Signed)
  Subjective:    Patient ID: Dustin Wang, male    DOB: August 21, 1965, 45 y.o.   MRN: [id number]  HPI For the past week he's noted some discomfort in the right eye which he describes as "inflammation". He denies fever, chills, sweats, or purulent drainage. He's had some blurring of vision but no double vision or loss of vision. The eye does water and is puffy  in the morning. He denies significant extrinsic rhinoconjunctivitis symptoms sneezing. No treatment except water gavage.    Review of Systems     Objective:   Physical Exam He appears mildly uncomfortable but in no acute distress  There is mild erythema of the conjunctiva of the right eye. Mild scleritis is present. Pupils were equal round reactive to light. Extraocular motion is intact. Vision is normal. No purulence is noted  Nares are patent with no exudate or purulence.  Oropharynx is unremarkable.  He has no lymphadenopathy the  neck or axilla        Assessment & Plan:  #1 mild conjunctivitis; rule out corneal abrasion  Plan: See orders and recommendations

## 2011-03-01 NOTE — Patient Instructions (Signed)
See your Ophthalmologist as soon as possible; the cornea may  have been scratched. Call if a referral as needed.

## 2011-03-27 ENCOUNTER — Other Ambulatory Visit: Payer: Self-pay | Admitting: Internal Medicine

## 2011-05-03 ENCOUNTER — Ambulatory Visit: Payer: 59 | Admitting: Psychology

## 2011-05-23 ENCOUNTER — Other Ambulatory Visit: Payer: Self-pay | Admitting: Internal Medicine

## 2011-06-13 ENCOUNTER — Other Ambulatory Visit: Payer: Self-pay | Admitting: Internal Medicine

## 2011-07-12 ENCOUNTER — Telehealth: Payer: Self-pay | Admitting: Internal Medicine

## 2011-07-12 DIAGNOSIS — I10 Essential (primary) hypertension: Secondary | ICD-10-CM

## 2011-07-12 NOTE — Telephone Encounter (Signed)
BMET,Lipids, hepatic panel  272.4/995.20/401.9

## 2011-07-12 NOTE — Telephone Encounter (Signed)
Patient states he is due for labs. I do not see anything in the chart. Please Advise.

## 2011-07-12 NOTE — Telephone Encounter (Signed)
Labs are scheduled.  

## 2011-07-29 ENCOUNTER — Other Ambulatory Visit: Payer: 59

## 2011-11-18 ENCOUNTER — Other Ambulatory Visit: Payer: Self-pay | Admitting: Internal Medicine

## 2011-12-17 ENCOUNTER — Other Ambulatory Visit: Payer: Self-pay | Admitting: Internal Medicine

## 2011-12-19 NOTE — Telephone Encounter (Signed)
Patient needs to schedule a CPX  

## 2012-02-29 ENCOUNTER — Other Ambulatory Visit: Payer: Self-pay | Admitting: Internal Medicine

## 2012-02-29 NOTE — Telephone Encounter (Signed)
Patient needs to schedule  CPX  

## 2012-03-16 ENCOUNTER — Other Ambulatory Visit: Payer: Self-pay | Admitting: Internal Medicine

## 2012-03-29 ENCOUNTER — Other Ambulatory Visit: Payer: Self-pay

## 2012-03-29 MED ORDER — LOSARTAN POTASSIUM 100 MG PO TABS: ORAL_TABLET | ORAL | Status: DC

## 2012-03-29 NOTE — Telephone Encounter (Signed)
Side note: Patient due for CPX (letter mailed to inform patient

## 2012-04-06 ENCOUNTER — Encounter: Payer: Self-pay | Admitting: Internal Medicine

## 2012-04-06 ENCOUNTER — Ambulatory Visit (INDEPENDENT_AMBULATORY_CARE_PROVIDER_SITE_OTHER): Payer: 59 | Admitting: Internal Medicine

## 2012-04-06 VITALS — BP 124/82 | HR 90 | Temp 98.0°F | Wt 246.0 lb

## 2012-04-06 DIAGNOSIS — R05 Cough: Secondary | ICD-10-CM

## 2012-04-06 DIAGNOSIS — J069 Acute upper respiratory infection, unspecified: Secondary | ICD-10-CM

## 2012-04-06 MED ORDER — HYDROCODONE-HOMATROPINE 5-1.5 MG/5ML PO SYRP: 5.0000 mL | ORAL_SOLUTION | Freq: Four times a day (QID) | ORAL | Status: DC | PRN

## 2012-04-06 MED ORDER — FLUTICASONE PROPIONATE 50 MCG/ACT NA SUSP: 1.0000 | Freq: Two times a day (BID) | NASAL | Status: DC | PRN

## 2012-04-06 MED ORDER — DOXYCYCLINE HYCLATE 100 MG PO TABS: 100.0000 mg | ORAL_TABLET | Freq: Two times a day (BID) | ORAL | Status: DC

## 2012-04-06 MED ORDER — LOSARTAN POTASSIUM 100 MG PO TABS: ORAL_TABLET | ORAL | Status: DC

## 2012-04-06 NOTE — Progress Notes (Signed)
  Subjective:    Patient ID: Dustin Wang, male    DOB: [birth year], 46 y.o.   MRN: [id number]  HPI  Symptoms began 2 weeks ago as head congestion and cough related to postnasal drainage. He may have had low-grade fever. He has some shortness of breath particularly when he felt hot. His mouth has stayed dry  Robitussin-DM and Coricidin BP or only a minimal benefit.    Review of Systems He denied extrinsic symptoms of itchy, watery eyes or sneezing. He's had no chills or sweats. He also denies frontal headache, nasal pain, dental pain, sore throat, otic pain, or otic discharge.  He has no wheezing with the cough.       Objective:   Physical Exam General appearance:good health ;well nourished; no acute distress or increased work of breathing is present.  No  lymphadenopathy about the head, neck, or axilla noted.   Eyes: No conjunctival inflammation or lid edema is present.   Ears:  External ear exam shows no significant lesions or deformities.  Otoscopic examination reveals clear canals, tympanic membranes are intact bilaterally without bulging, retraction, inflammation or discharge. Minor L TM scarring  Nose:  External nasal examination shows no deformity or inflammation. Nasal mucosa are pink and moist without lesions or exudates. No septal dislocation or deviation.No obstruction to airflow.   Oral exam: Dental hygiene is good; lips and gums are healthy appearing.There is no oropharyngeal erythema or exudate noted.   Neck:  No deformities,  masses, or tenderness noted.    Heart:  Normal rate and regular rhythm. S1 and S2 normal without gallop, murmur, click, rub or other extra sounds.   Lungs:Chest clear to auscultation; no wheezes, rhonchi,rales ,or rubs present.No increased work of breathing.  Dry cough  Extremities:  No cyanosis, edema, or clubbing  noted    Skin: Warm & dry           Assessment & Plan:  #1 URI w/o bronchitis; cough from  Post nasal drainage Plan: See  orders and recommendations

## 2012-04-06 NOTE — Patient Instructions (Addendum)
Plain Mucinex for thick secretions ;force NON dairy fluids . Use a Neti pot daily as needed for sinus congestion; going from open side to congested side . Nasal cleansing in the shower as discussed. Make sure that all residual soap is removed to prevent irritation. Fluticasone 1 spray in each nostril twice a day as needed. Use the "crossover" technique as discussed. Plain Allegra 160 daily as needed for itchy eyes & sneezing.  Blood Pressure Goal  Ideally is an AVERAGE < 135/85. This AVERAGE should be calculated from @ least 5-7 BP readings taken @ different times of day on different days of week. You should not respond to isolated BP readings , but rather the AVERAGE for that week . Please  schedule fasting Labs in next 90 days : BMET,Lipids, hepatic panel,  TSH. PLEASE BRING THESE INSTRUCTIONS TO FOLLOW UP  LAB APPOINTMENT.This will guarantee correct labs are drawn, eliminating need for repeat blood sampling ( needle sticks ! ). Diagnoses /Codes: 401.9,277.7   If you activate My Chart; the results can be released to you as soon as they populate from the lab. If you choose not to use this program; the labs have to be reviewed, copied & mailed   causing a delay in getting the results to you.

## 2012-04-13 ENCOUNTER — Telehealth: Payer: Self-pay | Admitting: *Deleted

## 2012-04-13 MED ORDER — AZITHROMYCIN 250 MG PO TABS: ORAL_TABLET | ORAL | Status: DC

## 2012-04-13 MED ORDER — PREDNISONE 20 MG PO TABS: 20.0000 mg | ORAL_TABLET | Freq: Two times a day (BID) | ORAL | Status: DC

## 2012-04-13 MED ORDER — ALBUTEROL SULFATE HFA 108 (90 BASE) MCG/ACT IN AERS: INHALATION_SPRAY | RESPIRATORY_TRACT | Status: DC

## 2012-04-13 NOTE — Telephone Encounter (Signed)
Rx sent left Pt detail message. Pt to return call to confirm VM received.

## 2012-04-13 NOTE — Telephone Encounter (Signed)
Pt left VM that he is still no better and was seen x1week ago. Pt still c/o dry cough, and coughing spell where he cough so much he began to dry vomit along with difficulty catching his breath. Pt states that in the past Dr hopper gave him a Rx for a inhaler Pt is now request Rx for inhaler. Pt does not know the name of med..Please advise

## 2012-04-13 NOTE — Telephone Encounter (Signed)
Albuterol 1-2 puffs every 4 hours as needed dispense 1 Zpack #1 Prednisone 20 mg bid #10 OVINB

## 2012-04-17 NOTE — Telephone Encounter (Signed)
Spoke with Pt who states he received VM and has picked up med and started it.

## 2012-04-20 ENCOUNTER — Other Ambulatory Visit: Payer: Self-pay | Admitting: Internal Medicine

## 2012-04-20 NOTE — Telephone Encounter (Signed)
Refill done.  

## 2012-04-26 ENCOUNTER — Encounter: Payer: Self-pay | Admitting: *Deleted

## 2012-04-26 ENCOUNTER — Encounter: Payer: Self-pay | Admitting: Internal Medicine

## 2012-04-26 ENCOUNTER — Ambulatory Visit (INDEPENDENT_AMBULATORY_CARE_PROVIDER_SITE_OTHER): Payer: 59 | Admitting: Internal Medicine

## 2012-04-26 ENCOUNTER — Ambulatory Visit (INDEPENDENT_AMBULATORY_CARE_PROVIDER_SITE_OTHER)
Admission: RE | Admit: 2012-04-26 | Discharge: 2012-04-26 | Disposition: A | Discharge status: Home / Self Care | Payer: 59 | Source: Ambulatory Visit | Attending: Internal Medicine | Admitting: Internal Medicine

## 2012-04-26 VITALS — BP 130/82 | HR 79 | Temp 97.6°F | Wt 245.2 lb

## 2012-04-26 DIAGNOSIS — J45901 Unspecified asthma with (acute) exacerbation: Secondary | ICD-10-CM

## 2012-04-26 DIAGNOSIS — J45909 Unspecified asthma, uncomplicated: Secondary | ICD-10-CM

## 2012-04-26 MED ORDER — BUDESONIDE-FORMOTEROL FUMARATE 160-4.5 MCG/ACT IN AERO: 2.0000 | INHALATION_SPRAY | Freq: Two times a day (BID) | RESPIRATORY_TRACT | Status: DC

## 2012-04-26 MED ORDER — PREDNISONE 20 MG PO TABS: 20.0000 mg | ORAL_TABLET | Freq: Two times a day (BID) | ORAL | Status: DC

## 2012-04-26 NOTE — Progress Notes (Signed)
  Subjective:    Patient ID: Dustin Wang, male    DOB: 30-Dec-1965, 47 y.o.   MRN: [id number]  HPI The respiratory tract symptoms began  In late November; data 04/06/12 OV verified   Nocturnal sweats w/o fever or chills  present   Cough was associated with  shortness of breath w/o wheezing . Sputum was described as clear, thick & scant as "plugs " or "pearls"     Treatment with  Zpack , Doxycycline, prednisone, albuterol  & generic Hydromet was partially effective  There is past history of asthmatic bronchitis. The patient had never smoked                   Review of Systems Symptoms not present included frontal headache, facial pain, dental pain, sore throat, nasal purulence, earache , and otic discharge Itchy , watery eyes & sneezing were not noted.    Objective:   Physical Exam General appearance:good health ;well nourished; no acute distress or increased work of breathing is present.  No  lymphadenopathy about the head, neck, or axilla noted.  Eyes: No conjunctival inflammation or lid edema is present.  Ears:  External ear exam shows no significant lesions or deformities.  Otoscopic examination reveals clear canals, tympanic membranes are intact bilaterally without bulging, retraction, inflammation or discharge. Nose:  External nasal examination shows no deformity or inflammation. Nasal mucosa are pink and moist without lesions or exudates. No septal dislocation or deviation.No obstruction to airflow.  Oral exam: Dental hygiene is good; lips and gums are healthy appearing.There is no oropharyngeal erythema or exudate noted.  Neck:  No deformities,  masses, or tenderness noted.   Heart:  Normal rate and regular rhythm. S1 and S2 normal without gallop, murmur, click, rub or other extra sounds.  Lungs: Dry upper airway cough present. Diffuse low-grade rhonchi. Musical wheezing right upper lobe posteriorly.  Extremities:  No cyanosis, edema, or clubbing  noted  Skin:  slightly damp.         Assessment & Plan:  #1 asthmatic bronchitis; asymmetric wheezing warrants imaging  Plan: See orders and recommendations

## 2012-04-26 NOTE — Patient Instructions (Addendum)
Order for x-rays entered into  the computer; these will be performed at [address]. across from [facility]. No appointment is necessary. Symbicort one-2  inhalations every 12 hours; gargle and spit after use .  If you activate My Chart; the results can be released to you as soon as they populate from the lab. If you choose not to use this program; the labs have to be reviewed, copied & mailed   causing a delay in getting the results to you.

## 2012-05-01 ENCOUNTER — Other Ambulatory Visit: Payer: Self-pay | Admitting: Internal Medicine

## 2012-05-01 DIAGNOSIS — I10 Essential (primary) hypertension: Secondary | ICD-10-CM

## 2012-05-01 DIAGNOSIS — E8881 Metabolic syndrome: Secondary | ICD-10-CM

## 2012-05-02 ENCOUNTER — Other Ambulatory Visit (INDEPENDENT_AMBULATORY_CARE_PROVIDER_SITE_OTHER): Payer: 59

## 2012-05-02 DIAGNOSIS — I10 Essential (primary) hypertension: Secondary | ICD-10-CM

## 2012-05-02 DIAGNOSIS — T887XXA Unspecified adverse effect of drug or medicament, initial encounter: Secondary | ICD-10-CM

## 2012-05-02 DIAGNOSIS — E785 Hyperlipidemia, unspecified: Secondary | ICD-10-CM

## 2012-05-02 DIAGNOSIS — E8881 Metabolic syndrome: Secondary | ICD-10-CM

## 2012-05-02 LAB — BASIC METABOLIC PANEL
Chloride: 107 mEq/L (ref 96–112)
GFR: 87.14 mL/min (ref >60.00)
Potassium: 3.5 mEq/L (ref 3.5–5.1)
Sodium: 139 mEq/L (ref 135–145)

## 2012-05-02 LAB — HEPATIC FUNCTION PANEL: Total Bilirubin: 0.8 mg/dL (ref 0.3–1.2)

## 2012-05-02 LAB — LIPID PANEL
HDL: 38.6 mg/dL — ABNORMAL LOW (ref >39.00)
Total CHOL/HDL Ratio: 4
VLDL: 37 mg/dL (ref 0.0–40.0)

## 2012-05-02 LAB — TSH: TSH: 0.4 u[IU]/mL (ref 0.35–5.50)

## 2012-05-21 ENCOUNTER — Other Ambulatory Visit: Payer: Self-pay | Admitting: Internal Medicine

## 2012-06-09 ENCOUNTER — Other Ambulatory Visit: Payer: Self-pay

## 2012-07-04 ENCOUNTER — Telehealth: Payer: Self-pay | Admitting: Internal Medicine

## 2012-07-04 MED ORDER — DILTIAZEM HCL ER 120 MG PO CP24: ORAL_CAPSULE | ORAL | Status: DC

## 2012-07-04 NOTE — Telephone Encounter (Signed)
RX sent

## 2012-07-04 NOTE — Telephone Encounter (Signed)
refil Diltiazem ER 120MG  Cap (XR-24HR) #90 last fille per fax 3.7.14, TK 1C PO D

## 2012-09-05 ENCOUNTER — Other Ambulatory Visit: Payer: Self-pay | Admitting: General Practice

## 2012-09-05 MED ORDER — LOSARTAN POTASSIUM 100 MG PO TABS: ORAL_TABLET | ORAL | Status: DC

## 2012-09-05 NOTE — Telephone Encounter (Signed)
Med filled.  

## 2012-12-07 ENCOUNTER — Encounter: Payer: Self-pay | Admitting: Internal Medicine

## 2012-12-07 ENCOUNTER — Ambulatory Visit (INDEPENDENT_AMBULATORY_CARE_PROVIDER_SITE_OTHER): Payer: BC Managed Care – PPO | Admitting: Internal Medicine

## 2012-12-07 VITALS — BP 130/78 | HR 92 | Wt 250.0 lb

## 2012-12-07 DIAGNOSIS — R0683 Snoring: Secondary | ICD-10-CM

## 2012-12-07 DIAGNOSIS — M79609 Pain in unspecified limb: Secondary | ICD-10-CM

## 2012-12-07 DIAGNOSIS — M79673 Pain in unspecified foot: Secondary | ICD-10-CM

## 2012-12-07 DIAGNOSIS — R0609 Other forms of dyspnea: Secondary | ICD-10-CM

## 2012-12-07 NOTE — Progress Notes (Signed)
  Subjective:    Patient ID: Dustin Wang, male    DOB: October 26, 1965, 47 y.o.   MRN: [id number]  HPI   For at least 3 months he's had pain in the heels. He'll have a " bruised" sensation in the heels when he walks. He also has a sensation of pins and needles in the heels after awakening at night and ambulating.  Also much less &  intermittent is a shooting pain along the medial aspect of each foot. This is more on the left than the right  The symptoms have been intermittent but over the last month have become more constant.  There's been no specific treatment or intervention. He does "walk it off".  His wife states he snores and also "holds his breath" at night    Review of Systems   He denies constant symptoms of fever, chills, sweats, or unexplained weight loss  He has no incontinence of urine or stool  He also denies weakness in the limbs.  The veins may be more prominent in the heel areas but there's been no definite rash or change in color or temperature.       Objective:   Physical Exam  Gen.:  well-nourished in appearance. Alert, appropriate and cooperative throughout exam.                               Musculoskeletal/extremities: No deformity or scoliosis noted of  the thoracic or lumbar spine.  No clubbing, cyanosis, edema, or significant extremity  deformity noted. Range of motion normal .Tone & strength  Normal. Joints normal. Nail health good. Able to lie down & sit up w/o help.  Vascular:  dorsalis pedis and  posterior tibial pulses are full and equal. . Neurologic: Alert and oriented x3. Deep tendon reflexes symmetrical and normal.  Gait normal but he has pain in the left heel greater than the right with heel walking. Tiptoe completed without difficulty. No pain to palpation or percussion of the Achilles tendons or arches. Slight tenderness to percussion over the left heel.        Skin: Intact without suspicious lesions or rashes. Lymph: No cervical, axillary  lymphadenopathy present. Psych: Mood and affect are normal. Normally interactive                                                                                        Assessment & Plan:  #1 heel pain; this may be related to a plantar fascia variant.  #2 snoring and possible sleep apnea  Plan: Exercises &  glucosamine recommended. Referral recommended to sports medicine.  Sleep apnea evaluation

## 2012-12-07 NOTE — Patient Instructions (Addendum)
Roll the affected foot over a tennis ball 20 times twice a day. After this soak the foot in warm Epsom salts for 15-20 minutes. Wear arch supports in both shoes. Sports Medicine referral made. Consider glucosamine sulfate 1500 mg daily for the fasciitis for 2-4 weeks ;this will rehydrate the soft tissues.  Use  cell phone camera to record snoring & "holding breath"; share with consultant.

## 2012-12-12 ENCOUNTER — Ambulatory Visit (INDEPENDENT_AMBULATORY_CARE_PROVIDER_SITE_OTHER): Payer: BC Managed Care – PPO | Admitting: Family Medicine

## 2012-12-12 ENCOUNTER — Encounter: Payer: Self-pay | Admitting: Family Medicine

## 2012-12-12 VITALS — BP 146/90 | HR 62 | Wt 251.0 lb

## 2012-12-12 DIAGNOSIS — M2022 Hallux rigidus, left foot: Secondary | ICD-10-CM | POA: Insufficient documentation

## 2012-12-12 DIAGNOSIS — M722 Plantar fascial fibromatosis: Secondary | ICD-10-CM

## 2012-12-12 DIAGNOSIS — M2020 Hallux rigidus, unspecified foot: Secondary | ICD-10-CM

## 2012-12-12 DIAGNOSIS — M2140 Flat foot [pes planus] (acquired), unspecified foot: Secondary | ICD-10-CM | POA: Insufficient documentation

## 2012-12-12 MED ORDER — MELOXICAM 15 MG PO TABS: 15.0000 mg | ORAL_TABLET | Freq: Every day | ORAL | Status: DC

## 2012-12-12 NOTE — Assessment & Plan Note (Addendum)
Plantar Fascitis: We reviewed that stretching is critically important to the treatment of PF. Reviewed footwear. Rigid soles have been shown to help with PF. Night splints can help. Reviewed rehab of stretching and calf raises.  Meloxicam prescribed Could benefit from a corticosteroid injection, orthotics, or other measures if conservative treatment fails. RTC in 2-4 weeks.

## 2012-12-12 NOTE — Progress Notes (Signed)
  I'm seeing this patient by the request  of:  Dr. Alwyn Ren  CC: Heel pain  HPI: Patient is a very pleasant 47 year old male who is coming in with a complaint of heel pain. Patient states that it is bilateral but seems to be more the left than the right. Patient states that it has been approximately 3 months duration. Patient states that the character of the pain is something like a bruise sensation. Patient can have radiating pain along the medial aspect of the arch in each foot. Patient states that for steps in the morning seem to be worse and does get better with activity. Patient states that the pain can wake him up at night as well. The patient has not tried any real home modalities at this time. Patient the severity of 7/10.  Past medical, surgical, family and social history reviewed. Medications reviewed all in the electronic medical record.   Review of Systems: No headache, visual changes, nausea, vomiting, diarrhea, constipation, dizziness, abdominal pain, skin rash, fevers, chills, night sweats, weight loss, swollen lymph nodes, body aches, joint swelling, muscle aches, chest pain, shortness of breath, mood changes.   Objective:    Blood pressure 146/90, pulse 62, weight 251 lb (113.853 kg), SpO2 97.00%.   General: No apparent distress alert and oriented x3 mood and affect normal, dressed appropriately.  HEENT: Pupils equal, extraocular movements intact Respiratory: Patient's speak in full sentences and does not appear short of breath Cardiovascular: No lower extremity edema, non tender, no erythema Skin: Warm dry intact with no signs of infection or rash on extremities or on axial skeleton. Abdomen: Soft nontender Neuro: Cranial nerves II through XII are intact, neurovascularly intact in all extremities with 2+ DTRs and 2+ pulses. Lymph: No lymphadenopathy of posterior or anterior cervical chain or axillae bilaterally.  Gait normal with good balance and coordination.  MSK: Non tender  with full range of motion and good stability and symmetric strength and tone of shoulders, elbows, wrist, hip, knee and ankles bilaterally.   Foot exam right Normal inspection with no visable or palpable fat pad atrophy and no visible swelling/erythema. Patient is tender at medial insertion of plantar fascia into calcaneus. Great toe motion: normal Arch shape:pes planus Other foot breakdown:none  Foot exam right Normal inspection with no visable or palpable fat pad atrophy and no visible swelling/erythema. Patient is tender at medial insertion of plantar fascia into calcaneus. Great toe motion: mild hallux rigidis Arch shape: pes planus Other foot breakdown:none  MSK US performed of: Bilateral foot ankle This study was ordered, performed, and interpreted by Terrilee Files D.O.  Foot/Ankle:   All structures visualized.   Talar dome unremarkable  Ankle mortise without effusion. Peroneus longus and brevis tendons unremarkable on long and transverse views without sheath effusions. Posterior tibialis, flexor hallucis longus, and flexor digitorum longus tendons unremarkable on long and transverse views without sheath effusions. Achilles tendon visualized along length of tendon and unremarkable on long and transverse views without sheath effusion. Anterior Talofibular Ligament and Calcaneofibular Ligaments unremarkable and intact. Deltoid Ligament unremarkable and intact. Plantar fascia right side shows the patient does have a small tear in the plantar fascia. This does have some neovascularization seen and does have enlargement 20.99 cm. Plantar fascia also shows enlargement with hypoechoic changes with a measurement of 1.47cm.   IMPRESSION:  Bilateral plantar fasciitis    Impression and Recommendations:     This case required medical decision making of moderate complexity.

## 2012-12-12 NOTE — Patient Instructions (Signed)
Please read handouts on Plantar Fascitis.  STRETCHING and Strengthening program critically important.  Strengthening on foot and calf muscles as seen in handout. Calf raises, 2 legged, then 1 legged. Foot massage with tennis ball. Ice massage.  Towel Scrunches: get a towel or hand towel, use toes to pick up and scrunch up the towel.  Marble pick-ups, practice picking up marbles with toes and placing into a cup  NEEDS TO BE DONE EVERY DAY  Recommended over the counter insoles. (Spenco or Hapad)  A rigid shoe with good arch support helps: Dansko (great), Randel Pigg, Merrell No easily bendable shoes.   Tuli's heel cups  Come back in 2-4 weeks and if you want we will make orthotics.   FRONT STAFF MAKE F/U 30 MIN.

## 2013-01-02 ENCOUNTER — Ambulatory Visit: Payer: BC Managed Care – PPO | Admitting: Family Medicine

## 2013-01-09 ENCOUNTER — Ambulatory Visit (INDEPENDENT_AMBULATORY_CARE_PROVIDER_SITE_OTHER): Payer: BC Managed Care – PPO | Admitting: Pulmonary Disease

## 2013-01-09 ENCOUNTER — Encounter: Payer: Self-pay | Admitting: Pulmonary Disease

## 2013-01-09 VITALS — BP 142/94 | HR 93 | Temp 97.9°F | Ht 71.0 in | Wt 253.6 lb

## 2013-01-09 DIAGNOSIS — G4733 Obstructive sleep apnea (adult) (pediatric): Secondary | ICD-10-CM

## 2013-01-09 NOTE — Progress Notes (Signed)
Subjective:    Patient ID: Dustin Wang, male    DOB: March 29, 1966, 47 y.o.   MRN: [id number]  HPI The pt is a 47y/o male who I have been asked to see for possible osa.  He has loud snoring per wife, as well as witnessed abnormal breathing pattern during sleep.  +gasping arousals on occasion.  He notes frequent awakenings at night, and has not rested in the mornings upon arising.  He denies any issues with sleepiness during the day, but states extremely busy at work.  As soon as he gets home and sits down to watch television or movies, he will fall asleep very easily.  He also notes sleepiness while driving at times on longer trips.  The patient states his weight is up about 30 pounds over the last 2 years, and his Epworth score today is 6.   Sleep Questionnaire What time do you typically go to bed?( Between what hours) 10-11p 10-11p at 1638 on 01/09/13 by Maisie Fus, CMA How long does it take you to fall asleep?  at 1638 on 01/09/13 by Maisie Fus, CMA How many times during the night do you wake up? 4 4 at 1638 on 01/09/13 by Maisie Fus, CMA What time do you get out of bed to start your day? 0730 0730 at 1638 on 01/09/13 by Maisie Fus, CMA Do you drive or operate heavy machinery in your occupation? No No at 1638 on 01/09/13 by Maisie Fus, CMA How much has your weight changed (up or down) over the past two years? (In pounds) 30 lb (13.608 kg)30 lb (13.608 kg) increase at 1638 on 01/09/13 by Maisie Fus, CMA Have you ever had a sleep study before? No No at 1638 on 01/09/13 by Maisie Fus, CMA Do you currently use CPAP? No No at 1638 on 01/09/13 by Maisie Fus, CMA Do you wear oxygen at any time? No No at 1638 on 01/09/13 by Maisie Fus, CMA   Review of Systems  Constitutional: Negative for fever and unexpected weight change.  HENT: Negative for ear pain, nosebleeds, congestion, sore throat, rhinorrhea, sneezing, trouble swallowing,  dental problem, postnasal drip and sinus pressure.   Eyes: Negative for redness and itching.  Respiratory: Positive for shortness of breath. Negative for cough, chest tightness and wheezing.   Cardiovascular: Negative for palpitations and leg swelling.  Gastrointestinal: Negative for nausea and vomiting.       Acid heartburn//indigestion  Genitourinary: Negative for dysuria.  Musculoskeletal: Negative for joint swelling.  Skin: Negative for rash.  Neurological: Negative for headaches.  Hematological: Does not bruise/bleed easily.  Psychiatric/Behavioral: Negative for dysphoric mood. The patient is not nervous/anxious.        Objective:   Physical Exam Constitutional:  Overweight male, no acute distress  HENT:  Nares patent without discharge  Oropharynx without exudate, palate and uvula are thickened and elongated  Eyes:  Perrla, eomi, no scleral icterus  Neck:  No JVD, no TMG  Cardiovascular:  Normal rate, regular rhythm, no rubs or gallops.  No murmurs        Intact distal pulses  Pulmonary :  Normal breath sounds, no stridor or respiratory distress   No rales, rhonchi, or wheezing  Abdominal:  Soft, nondistended, bowel sounds present.  No tenderness noted.   Musculoskeletal:  No lower extremity edema noted.  Lymph Nodes:  No cervical lymphadenopathy noted  Skin:  No cyanosis noted  Neurologic:  Alert, appropriate,  moves all 4 extremities without obvious deficit.         Assessment & Plan:

## 2013-01-09 NOTE — Assessment & Plan Note (Signed)
The patient's history is very suggestive of clinically significant obstructive sleep apnea.  Have reviewed the pathophysiology of sleep apnea with him, including its impact to his quality of life and cardiovascular health.  He will need a sleep study for diagnosis, and I think he is an excellent candidate for home sleep testing.  The patient is agreeable to this approach.

## 2013-01-09 NOTE — Patient Instructions (Signed)
Will set up for home sleep testing.  Will arrange followup to discuss once results are available Work on weight loss.

## 2013-01-16 ENCOUNTER — Encounter: Payer: Self-pay | Admitting: Family Medicine

## 2013-01-16 ENCOUNTER — Ambulatory Visit (INDEPENDENT_AMBULATORY_CARE_PROVIDER_SITE_OTHER): Payer: BC Managed Care – PPO | Admitting: Family Medicine

## 2013-01-16 VITALS — BP 142/88 | HR 75 | Wt 255.0 lb

## 2013-01-16 DIAGNOSIS — M722 Plantar fascial fibromatosis: Secondary | ICD-10-CM

## 2013-01-16 MED ORDER — NITROGLYCERIN 0.2 MG/HR TD PT24: MEDICATED_PATCH | TRANSDERMAL | Status: DC

## 2013-01-16 NOTE — Progress Notes (Signed)
  Subjective:    CC: bilateral heel pain  HPI: Patient is a 47 year old gentleman coming in for bilateral plantar fasciitis. Patient was given home exercises, anti-inflammatories, icing protocol at last visit. Patient has been considerable improvement and states that he is approximately 75% better. Patient feels like the pain of the right side is almost gone, but he is still having some pain at the heel of the left side. Patient states still it is worse after sitting long amount of time at first it is in the morning. Patient denies any new symptoms such as swelling or numbness. Patient is still able to do his activities of daily living. Patient is even more motivated to get in shape because he is one contest and needs to be physically fit for this in 2 months.  Past medical history, Surgical history, Family history not pertinant except as noted below, Social history, Allergies, and medications have been entered into the medical record, reviewed, and no changes needed.   Review of Systems: No fevers, chills, night sweats, weight loss, chest pain, or shortness of breath.   Objective:   Blood pressure 142/88, pulse 75, weight 255 lb (115.667 kg), SpO2 97.00%.  General: Well Developed, well nourished, and in no acute distress.  Neuro: Alert and oriented x3, extra-ocular muscles intact, sensation grossly intact.  HEENT: Normocephalic, atraumatic, pupils equal round reactive to light, neck supple, no masses, no lymphadenopathy, thyroid nonpalpable.  Skin: Warm and dry, no rashes. Cardiac:  no lower extremity edema. Respiratory: Not using accessory muscles, speaking in full sentences. Abdominal: NT, soft Gait: Nonantlagic, good balance and coordination Lymphatic: no lymphadenopathy in neck or axillae on palpation, non tender.  Musculoskeletal: Inspection and palpation of the right and left upper extremities including the shoulders elbows and wrist are unremarkable with full range of motion and good  muscle strength and tone. Inspection and palpation of the right and left lower extremities including the hips knees and ankles are unremarkable and nontender with full range of motion and good muscle strength and tone and are symmetric. Foot exam right  Normal inspection with no visable or palpable fat pad atrophy and no visible swelling/erythema.  Patient is tender at medial insertion of plantar fascia into calcaneus mostly on the left heel.  None on the right.  Great toe motion: mild hallux rigidis  Arch shape: pes planus  Other foot breakdown:none   Impression and Recommendations:

## 2013-01-16 NOTE — Patient Instructions (Signed)
You are doing much better which makes me happy Keep doing exercises!  At least most days of the week.  Nitroglycerin Protocol   Apply 1/4 nitroglycerin patch to affected area daily.  Change position of patch within the affected area every 24 hours.  You may experience a headache during the first 1-2 weeks of using the patch, these should subside.  If you experience headaches after beginning nitroglycerin patch treatment, you may take your preferred over the counter pain reliever.  Another side effect of the nitroglycerin patch is skin irritation or rash related to patch adhesive.  Please notify our office if you develop more severe headaches or rash, and stop the patch.  Tendon healing with nitroglycerin patch may require 12 to 24 weeks depending on the extent of injury.  Men should not use if taking Viagra, Cialis, or Levitra.   Do not use if you have migraines or rosacea.   Come back in 4 weeks to make sure you are better for your race.

## 2013-01-16 NOTE — Assessment & Plan Note (Signed)
Patient has made some improvement since last visit. Patient will continue to do the exercises and as well as the icing. Patient was given nitroglycerin patches to try to help with increased healing. I do expect patient to have this completely resolved and next followup in one month.

## 2013-01-17 ENCOUNTER — Encounter: Payer: Self-pay | Admitting: Pulmonary Disease

## 2013-01-17 ENCOUNTER — Telehealth: Payer: Self-pay | Admitting: Pulmonary Disease

## 2013-01-17 NOTE — Telephone Encounter (Signed)
Checked his anthem bcbs no precert required Tobe Sos

## 2013-01-17 NOTE — Telephone Encounter (Signed)
[facility], please check on precert for this pt and let him know something.

## 2013-01-17 NOTE — Telephone Encounter (Signed)
KC patient was seen 01-09-13; told Home sleep study would be arranged; no order placed. Please advise. Thanks.

## 2013-01-21 ENCOUNTER — Other Ambulatory Visit: Payer: Self-pay | Admitting: Pulmonary Disease

## 2013-01-21 DIAGNOSIS — G4733 Obstructive sleep apnea (adult) (pediatric): Secondary | ICD-10-CM

## 2013-01-21 NOTE — Telephone Encounter (Signed)
Let pt know order for home sleep test in .

## 2013-01-22 NOTE — Telephone Encounter (Signed)
lmomtcb Dustin Wang ° °

## 2013-01-24 NOTE — Telephone Encounter (Signed)
bcbs ok for pt to do home sleep study pt will pick up alice 01/30/13@4 :00pm pt is aware Dustin Wang

## 2013-02-05 ENCOUNTER — Other Ambulatory Visit: Payer: Self-pay | Admitting: Internal Medicine

## 2013-02-06 ENCOUNTER — Other Ambulatory Visit: Payer: Self-pay | Admitting: *Deleted

## 2013-02-06 MED ORDER — LOSARTAN POTASSIUM 100 MG PO TABS: ORAL_TABLET | ORAL | Status: DC

## 2013-02-06 NOTE — Telephone Encounter (Signed)
Losartan refill sent to pharmacy 

## 2013-02-13 ENCOUNTER — Ambulatory Visit (INDEPENDENT_AMBULATORY_CARE_PROVIDER_SITE_OTHER): Payer: BC Managed Care – PPO | Admitting: Family Medicine

## 2013-02-13 ENCOUNTER — Encounter: Payer: Self-pay | Admitting: Family Medicine

## 2013-02-13 VITALS — BP 152/90 | HR 69 | Wt 253.0 lb

## 2013-02-13 DIAGNOSIS — M6740 Ganglion, unspecified site: Secondary | ICD-10-CM | POA: Insufficient documentation

## 2013-02-13 DIAGNOSIS — M722 Plantar fascial fibromatosis: Secondary | ICD-10-CM

## 2013-02-13 NOTE — Assessment & Plan Note (Signed)
Encourage patient to wear the nitroglycerin patch 24 hours a day. Discussed proper shoe wear Discuss weight loss Patient will continue exercises Come back in 2-3 weeks after patient's race. If he continues to have pain we can consider custom orthotics

## 2013-02-13 NOTE — Progress Notes (Signed)
  Subjective:    CC: bilateral heel pain  HPI: Patient is a 47 year old gentleman coming in for bilateral plantar fasciitis. Patient was given home exercises, anti-inflammatories, icing protocol as well as nitroglycerin patches. Patient states he is only approximately another 10% better from previous visit. Patient is wearing a nitroglycerin patch at night and has not been wearing it during the day. This was a miscommunication. Patient states overall he is continuing to be much better than he was initially. Patient's is able to do all activities of daily living without any significant trouble patient denies any significant new symptoms.  Past medical history, Surgical history, Family history not pertinant except as noted below, Social history, Allergies, and medications have been entered into the medical record, reviewed, and no changes needed.   Review of Systems: No fevers, chills, night sweats, weight loss, chest pain, or shortness of breath.   Objective:   Blood pressure 152/90, pulse 69, weight 253 lb (114.76 kg), SpO2 96.00%.  General: Well Developed, well nourished, and in no acute distress.  Neuro: Alert and oriented x3, extra-ocular muscles intact, sensation grossly intact.  HEENT: Normocephalic, atraumatic, pupils equal round reactive to light, neck supple, no masses, no lymphadenopathy, thyroid nonpalpable.  Skin: Warm and dry, no rashes. Cardiac:  no lower extremity edema. Respiratory: Not using accessory muscles, speaking in full sentences. Abdominal: NT, soft Gait: Nonantlagic, good balance and coordination Lymphatic: no lymphadenopathy in neck or axillae on palpation, non tender.  Musculoskeletal: Inspection and palpation of the right and left upper extremities including the shoulders elbows and wrist are unremarkable with full range of motion and good muscle strength and tone. Inspection and palpation of the right and left lower extremities including the hips knees and ankles  are unremarkable and nontender with full range of motion and good muscle strength and tone and are symmetric. Foot exam bilateral Normal inspection with no visable or palpable fat pad atrophy and no visible swelling/erythema.  Patient is tender at medial insertion of plantar fascia into calcaneus mostly on the left heel.  None on the right.  Great toe motion: mild hallux rigidis  Arch shape: pes planus  Other foot breakdown:none Left anterior lateral ankle does have what appears to be a ganglion cyst.   Impression and Recommendations:

## 2013-02-13 NOTE — Assessment & Plan Note (Signed)
This could be contributing to some of patient's pain. Did not see a significant amount of fluid but may be the ganglion can be aspirated which will relieve some of the pressure that he is having in this area. We will discuss this further at next followup.

## 2013-02-13 NOTE — Patient Instructions (Signed)
Good to see you Start the patch 1/4 for full 24 hour and in 2 weeks if no side effects go up to 1/2 patch daily. Do that after your race.  Continue icing and stretching most days of the week.  Shoes try to find some that do not taper in the midfoot.  Come back in 3 weeks for either the cyst aspiration if needed.  Or we can also do orthotics if plantar is not much better.

## 2013-02-28 ENCOUNTER — Other Ambulatory Visit: Payer: Self-pay

## 2013-03-06 ENCOUNTER — Encounter: Payer: Self-pay | Admitting: Family Medicine

## 2013-03-06 ENCOUNTER — Ambulatory Visit (INDEPENDENT_AMBULATORY_CARE_PROVIDER_SITE_OTHER): Payer: BC Managed Care – PPO | Admitting: Family Medicine

## 2013-03-06 VITALS — BP 140/82 | HR 76 | Wt 253.0 lb

## 2013-03-06 DIAGNOSIS — M722 Plantar fascial fibromatosis: Secondary | ICD-10-CM

## 2013-03-06 NOTE — Patient Instructions (Signed)
Congrats Continue icing at night  Exercises now 3 times a week.  Nitro patch still daily x 1 month then 1 month at every other day then try to stop.  See me when you need me.

## 2013-03-06 NOTE — Assessment & Plan Note (Signed)
Patient at this time is doing significantly better. We will decrease exercises to 3 times a week. Patient will continue to do icing at night. Patient is told to get a nitroglycerin patch daily for another month then every other day for a month and then discontinue. His lungs patient's pain stays away he can follow up on an as-needed basis. If he does return we would consider the custom orthotics and once again increasing the amount of exercises and nitroglycerin patch.

## 2013-03-06 NOTE — Progress Notes (Signed)
  Subjective:    CC: bilateral heel pain followup  HPI: Patient is a 47 year old gentleman coming in for bilateral plantar fasciitis. Patient was given home exercises, anti-inflammatories, icing protocol as well as nitroglycerin patches. We did increase the amount of nitroglycerin patches to a half patch daily. Patient was started to do exercises on more regular basis as well. Patient states he is approximately 90% better. Patient is only having a mild twinge from time to time but overall is able to do all activities of daily living. Patient was able to participate in the events he is looking forward to did not have any significant pain. Patient is having no side effects to the nitroglycerin patch. Patient is very happy with the results.   Past medical history, Surgical history, Family history not pertinant except as noted below, Social history, Allergies, and medications have been entered into the medical record, reviewed, and no changes needed.   Review of Systems: No fevers, chills, night sweats, weight loss, chest pain, or shortness of breath.   Objective:   There were no vitals taken for this visit.  General: Well Developed, well nourished, and in no acute distress.  Neuro: Alert and oriented x3, extra-ocular muscles intact, sensation grossly intact.  HEENT: Normocephalic, atraumatic, pupils equal round reactive to light, neck supple, no masses, no lymphadenopathy, thyroid nonpalpable.  Skin: Warm and dry, no rashes. Cardiac:  no lower extremity edema. Respiratory: Not using accessory muscles, speaking in full sentences. Abdominal: NT, soft Gait: Nonantlagic, good balance and coordination Lymphatic: no lymphadenopathy in neck or axillae on palpation, non tender.  Musculoskeletal: Inspection and palpation of the right and left upper extremities including the shoulders elbows and wrist are unremarkable with full range of motion and good muscle strength and tone. Inspection and palpation  of the right and left lower extremities including the hips knees and ankles are unremarkable and nontender with full range of motion and good muscle strength and tone and are symmetric. Foot exam bilateral Normal inspection with no visable or palpable fat pad atrophy and no visible swelling/erythema.  Patient is now nontender tender at medial insertion of plantar fascia into calcaneus mostly on the left heel as well as the right he  Great toe motion: mild hallux rigidis bilaterally Arch shape: pes planus  Other foot breakdown:none Left anterior lateral ankle does have what appears to be a ganglion cyst.   Impression and Recommendations:

## 2013-04-14 ENCOUNTER — Other Ambulatory Visit: Payer: Self-pay | Admitting: Internal Medicine

## 2013-04-16 NOTE — Telephone Encounter (Signed)
Diltiazem refilled per protocol. JG//CMA 

## 2013-05-22 ENCOUNTER — Other Ambulatory Visit: Payer: Self-pay | Admitting: Internal Medicine

## 2013-05-23 NOTE — Telephone Encounter (Signed)
Rx sent to the pharmacy by e-script.//AB/CMA 

## 2013-08-18 ENCOUNTER — Other Ambulatory Visit: Payer: Self-pay | Admitting: Internal Medicine

## 2013-10-20 ENCOUNTER — Other Ambulatory Visit: Payer: Self-pay | Admitting: Internal Medicine

## 2013-12-03 ENCOUNTER — Encounter: Payer: Self-pay | Admitting: Internal Medicine

## 2013-12-03 ENCOUNTER — Ambulatory Visit (INDEPENDENT_AMBULATORY_CARE_PROVIDER_SITE_OTHER): Payer: BC Managed Care – PPO | Admitting: Internal Medicine

## 2013-12-03 VITALS — BP 134/85 | HR 65 | Temp 98.2°F | Wt 256.2 lb

## 2013-12-03 DIAGNOSIS — R059 Cough, unspecified: Secondary | ICD-10-CM

## 2013-12-03 DIAGNOSIS — R05 Cough: Secondary | ICD-10-CM

## 2013-12-03 DIAGNOSIS — J309 Allergic rhinitis, unspecified: Secondary | ICD-10-CM

## 2013-12-03 DIAGNOSIS — K219 Gastro-esophageal reflux disease without esophagitis: Secondary | ICD-10-CM

## 2013-12-03 MED ORDER — RANITIDINE HCL 150 MG PO TABS: 150.0000 mg | ORAL_TABLET | ORAL | Status: DC | PRN

## 2013-12-03 NOTE — Progress Notes (Signed)
   Subjective:    Patient ID: Dustin Wang, male    DOB: 1966-01-17, 48 y.o.   MRN: [id number]  HPI  He developed a cough last week which is exacerbated by food intake. It will last 1-2 minutes. It is nonproductive but slightly rattling.  He denies intake of aspirin or nonsteroidals. He does not drink alcohol. He also is a nonsmoker. He will drink at least two16 ounce colas a day.  He's had some minor itchy, watery eyes. He is also had some chills. There's been some sneezing as well as some wheezing with associated shortness of breath.He does describe some postnasal drainage as well.   Dyspepsia is not associated with dysphagia although he has had this in the past.    Review of Systems  He denies fever or sweats.  He has no pleuritic pain  He has no frontal headache, facial pain, or nasal purulence        Objective:   Physical Exam Positive or pertinent findings include: Some erythema in the nares. Scattered minimal expiratory wheezing. No increased work of breathing Significant central obesity with protuberant abdomen.  General appearance:good health ;well nourished; no acute distress or increased work of breathing is present.  No  lymphadenopathy about the head, neck, or axilla noted.  Eyes: No conjunctival inflammation or lid edema is present. There is no scleral icterus. Ears:  External ear exam shows no significant lesions or deformities.  Otoscopic examination reveals clear canals, tympanic membranes are intact bilaterally without bulging, retraction, inflammation or discharge. Nose:  External nasal examination shows no deformity or inflammation. Nasal mucosa without lesions or exudates. No septal dislocation or deviation.No obstruction to airflow.  Oral exam: Dental hygiene is good; lips and gums are healthy appearing.There is no oropharyngeal erythema or exudate noted.  Neck:  No deformities, thyromegaly, masses, or tenderness noted.   Heart:  Normal rate and regular  rhythm. S1 and S2 normal without gallop, murmur, click, rub or other extra sounds.  Extremities:  No cyanosis, edema, or clubbing  noted Skin: Warm & dry w/o jaundice or tenting.            Assessment & Plan:  #1 cough with some reactive airways component. Triggers appear to be reflux as well as some postnasal drainage  Plan: See orders and recommendations

## 2013-12-03 NOTE — Patient Instructions (Signed)
Reflux of gastric acid may be asymptomatic as this may occur mainly during sleep.The triggers for reflux  include stress; the "aspirin family" ; alcohol; peppermint; and caffeine (coffee, tea, cola, and chocolate). The aspirin family would include aspirin and the nonsteroidal agents such as ibuprofen &  Naproxen. Tylenol would not cause reflux. If having symptoms ; food & drink should be avoided for @ least 2 hours before going to bed. Take the ranitidine 30 minutes before breakfast and evening meal. If this does not control reflux symptoms;start omeprazole 20 mg  30 minutes before breakfast in place of the ranitidine.  Plain Mucinex (NOT D) for thick secretions ;force NON dairy fluids .   Nasal cleansing in the shower as discussed with lather of mild shampoo.After 10 seconds wash off lather while  exhaling through nostrils. Make sure that all residual soap is removed to prevent irritation.  Flonase OR Nasacort AQ 1 spray in each nostril twice a day as needed. Use the "crossover" technique into opposite nostril spraying toward opposite ear @ 45 degree angle, not straight up into nostril.  Use a Neti pot daily only  as needed for significant sinus congestion; going from open side to congested side . Plain Allegra (NOT D )  160 daily , Loratidine 10 mg , OR Zyrtec 10 mg @ bedtime  as needed for itchy eyes & sneezing.   Breo one inhalations daily; gargle and spit after use. Lot #:[id number] Exp 02/17 Instructions written on box by Dr Lennart PallWm Hopper

## 2013-12-03 NOTE — Progress Notes (Signed)
Pre visit review using our clinic review tool, if applicable. No additional management support is needed unless otherwise documented below in the visit note. 

## 2013-12-09 ENCOUNTER — Encounter: Payer: Self-pay | Admitting: Internal Medicine

## 2013-12-09 ENCOUNTER — Telehealth: Payer: Self-pay | Admitting: *Deleted

## 2013-12-09 NOTE — Telephone Encounter (Signed)
30 mg before breakfast and before the evening meal.  Symbicort 160/50  2 puffs every 12 hours. Separate the 2 puffs by 10 minutes and gargle and spit after inhalation of second puff

## 2013-12-09 NOTE — Telephone Encounter (Signed)
Pt states he is still having cough. Not coughing up phlegm, but has some rattling in his chest, also post nasal drip and sneezing. Denies fever or SOB. Have hx with bronchitis requesting md recommendation on cough...Dustin Wang/lmb

## 2013-12-10 ENCOUNTER — Telehealth: Payer: Self-pay | Admitting: *Deleted

## 2013-12-10 MED ORDER — BUDESONIDE-FORMOTEROL FUMARATE 160-4.5 MCG/ACT IN AERO: 2.0000 | INHALATION_SPRAY | Freq: Two times a day (BID) | RESPIRATORY_TRACT | Status: DC | PRN

## 2013-12-10 MED ORDER — MONTELUKAST SODIUM 10 MG PO TABS: 10.0000 mg | ORAL_TABLET | Freq: Every day | ORAL | Status: DC

## 2013-12-10 NOTE — Telephone Encounter (Signed)
Message copied by Deatra JamesBRAND, Jessamy Torosyan M on Tue Dec 10, 2013  8:16 AM ------      Message from: Pecola LawlessHOPPER, Javonn F      Created: Mon Dec 09, 2013  4:48 PM       Symbicort 160 one - two inhalations every 12 hours; gargle and spit after use            Singulair 10 mg qd #30 ------

## 2013-12-10 NOTE — Telephone Encounter (Signed)
Pt return call back gave him md response. Will send med to [facility]...Raechel Chute/lmb

## 2013-12-10 NOTE — Telephone Encounter (Signed)
Message copied by Deatra JamesBRAND, Nialah Saravia M on Tue Dec 10, 2013 12:09 PM ------      Message from: Pecola LawlessHOPPER, Adreyan F      Created: Mon Dec 09, 2013  4:48 PM       Symbicort 160 one - two inhalations every 12 hours; gargle and spit after use            Singulair 10 mg qd #30 ------

## 2013-12-10 NOTE — Telephone Encounter (Signed)
Called pt no answer LMOM RTC.../lmb 

## 2013-12-10 NOTE — Telephone Encounter (Signed)
See previous msg pt has been advise of md recommendations...Raechel Chute/lmb

## 2013-12-10 NOTE — Telephone Encounter (Signed)
A user error has taken place: open by mistake.../lmb  

## 2014-01-16 ENCOUNTER — Other Ambulatory Visit: Payer: Self-pay | Admitting: Internal Medicine

## 2014-02-11 ENCOUNTER — Other Ambulatory Visit: Payer: Self-pay | Admitting: Internal Medicine

## 2014-02-17 ENCOUNTER — Other Ambulatory Visit: Payer: Self-pay

## 2014-02-17 DIAGNOSIS — R059 Cough, unspecified: Secondary | ICD-10-CM

## 2014-02-17 DIAGNOSIS — R05 Cough: Secondary | ICD-10-CM

## 2014-02-17 DIAGNOSIS — K219 Gastro-esophageal reflux disease without esophagitis: Secondary | ICD-10-CM

## 2014-02-17 MED ORDER — RANITIDINE HCL 150 MG PO TABS: 150.0000 mg | ORAL_TABLET | ORAL | Status: DC | PRN

## 2014-03-10 ENCOUNTER — Encounter: Payer: Self-pay | Admitting: Internal Medicine

## 2014-03-10 ENCOUNTER — Ambulatory Visit (INDEPENDENT_AMBULATORY_CARE_PROVIDER_SITE_OTHER): Payer: BC Managed Care – PPO | Admitting: Internal Medicine

## 2014-03-10 VITALS — BP 120/84 | HR 74 | Temp 97.9°F | Resp 14 | Wt 251.2 lb

## 2014-03-10 DIAGNOSIS — J4521 Mild intermittent asthma with (acute) exacerbation: Secondary | ICD-10-CM

## 2014-03-10 MED ORDER — BUDESONIDE-FORMOTEROL FUMARATE 160-4.5 MCG/ACT IN AERO: 2.0000 | INHALATION_SPRAY | Freq: Two times a day (BID) | RESPIRATORY_TRACT | Status: DC | PRN

## 2014-03-10 MED ORDER — PREDNISONE 20 MG PO TABS
20.0000 mg | ORAL_TABLET | Freq: Two times a day (BID) | ORAL | Status: DC
Start: 2014-03-10 — End: 2015-01-27

## 2014-03-10 NOTE — Progress Notes (Signed)
   Subjective:    Patient ID: Dustin Wang, male    DOB: [birth year], 48 y.o.   MRN: [id number]  HPI    He has had a cough for several months which is intermittent. He rarely produces sputum and when he does it is clear.  The cough is intermittent and variable but can be paroxysmal to the point of dry heaves. It was exacerbated 11/13  Triggers include getting overheated. Also smoke and perfume are triggers  He has a history of asthma with bronchitis.  He had allergies as a young adult but "outgrew them".  He denies significant extrinsic or upper respiratory tract symptoms.   Review of Systems Frontal headache, facial pain , nasal purulence, dental pain, sore throat , otic pain or otic discharge denied. No fever , chills or sweats. Extrinsic symptoms of itchy, watery eyes, sneezing, or angioedema are denied. There is no paroxysmal nocturnal dyspnea.     Objective:   Physical Exam   Pertinent or positive findings include: He has diffuse low-grade musical wheezing in all lung fields. Remainder of exam is unremarkable.  General appearance:good health ;well nourished; no acute distress or increased work of breathing is present.  No  lymphadenopathy about the head, neck, or axilla noted.  Eyes: No conjunctival inflammation or lid edema is present. There is no scleral icterus. Ears:  External ear exam shows no significant lesions or deformities.  Otoscopic examination reveals clear canals, tympanic membranes are intact bilaterally without bulging, retraction, inflammation or discharge. Nose:  External nasal examination shows no deformity or inflammation. Nasal mucosa are pink and moist without lesions or exudates. No septal dislocation or deviation.No obstruction to airflow.  Oral exam: Dental hygiene is good; lips and gums are healthy appearing.There is no oropharyngeal erythema or exudate noted.  Neck:  No deformities, thyromegaly, masses, or tenderness noted.    Heart:  Normal rate  and regular rhythm. S1 and S2 normal without gallop, murmur, click, rub or other extra sounds.  Extremities:  No cyanosis, edema, or clubbing  noted  Skin: Warm & dry w/o jaundice or tenting.         Assessment & Plan:  #1 mild intermittent asthma manifested as cough  Plan: Resume the Symbicort 2 puffs every 12 hours Add Prednisone if no better in 72 hours

## 2014-03-10 NOTE — Progress Notes (Signed)
Pre visit review using our clinic review tool, if applicable. No additional management support is needed unless otherwise documented below in the visit note. 

## 2014-03-10 NOTE — Patient Instructions (Signed)
Fill the  prescription for Prednisone it there is not dramatic improvement in the next 72 hours.

## 2014-04-04 ENCOUNTER — Encounter: Payer: Self-pay | Admitting: Internal Medicine

## 2014-04-06 ENCOUNTER — Other Ambulatory Visit: Payer: Self-pay | Admitting: Internal Medicine

## 2014-04-07 ENCOUNTER — Other Ambulatory Visit: Payer: Self-pay | Admitting: Internal Medicine

## 2014-04-07 DIAGNOSIS — R059 Cough, unspecified: Secondary | ICD-10-CM

## 2014-04-07 DIAGNOSIS — K219 Gastro-esophageal reflux disease without esophagitis: Secondary | ICD-10-CM

## 2014-04-07 DIAGNOSIS — R05 Cough: Secondary | ICD-10-CM

## 2014-04-07 MED ORDER — RANITIDINE HCL 150 MG PO TABS: 150.0000 mg | ORAL_TABLET | Freq: Two times a day (BID) | ORAL | Status: DC

## 2014-06-02 ENCOUNTER — Other Ambulatory Visit: Payer: Self-pay | Admitting: Internal Medicine

## 2014-11-03 ENCOUNTER — Telehealth: Payer: Self-pay | Admitting: Emergency Medicine

## 2014-11-03 ENCOUNTER — Other Ambulatory Visit: Payer: Self-pay | Admitting: Emergency Medicine

## 2014-11-03 MED ORDER — DILTIAZEM HCL ER 120 MG PO CP24: 120.0000 mg | ORAL_CAPSULE | Freq: Every day | ORAL | Status: DC

## 2014-11-03 NOTE — Telephone Encounter (Signed)
#  30 Needs OV for refills

## 2014-11-03 NOTE — Telephone Encounter (Signed)
Received refill request from pharm for Diltiazem, last prescribed 9/15 for #90, last OV 11/15. Please advise

## 2014-12-12 ENCOUNTER — Other Ambulatory Visit: Payer: Self-pay | Admitting: Internal Medicine

## 2014-12-12 ENCOUNTER — Other Ambulatory Visit: Payer: Self-pay | Admitting: Emergency Medicine

## 2014-12-12 MED ORDER — DILTIAZEM HCL ER 120 MG PO CP24: 120.0000 mg | ORAL_CAPSULE | Freq: Every day | ORAL | Status: DC

## 2014-12-12 MED ORDER — LOSARTAN POTASSIUM 100 MG PO TABS: 100.0000 mg | ORAL_TABLET | Freq: Every day | ORAL | Status: DC

## 2014-12-29 ENCOUNTER — Other Ambulatory Visit: Payer: Self-pay | Admitting: Internal Medicine

## 2014-12-31 ENCOUNTER — Other Ambulatory Visit: Payer: Self-pay | Admitting: Emergency Medicine

## 2014-12-31 DIAGNOSIS — R05 Cough: Secondary | ICD-10-CM

## 2014-12-31 DIAGNOSIS — R059 Cough, unspecified: Secondary | ICD-10-CM

## 2014-12-31 DIAGNOSIS — K219 Gastro-esophageal reflux disease without esophagitis: Secondary | ICD-10-CM

## 2014-12-31 MED ORDER — RANITIDINE HCL 150 MG PO TABS: 150.0000 mg | ORAL_TABLET | Freq: Two times a day (BID) | ORAL | Status: DC

## 2015-01-06 ENCOUNTER — Other Ambulatory Visit: Payer: Self-pay | Admitting: Internal Medicine

## 2015-01-16 ENCOUNTER — Other Ambulatory Visit: Payer: Self-pay | Admitting: Internal Medicine

## 2015-01-19 ENCOUNTER — Telehealth: Payer: Self-pay | Admitting: Emergency Medicine

## 2015-01-19 MED ORDER — LOSARTAN POTASSIUM 100 MG PO TABS: 100.0000 mg | ORAL_TABLET | Freq: Every day | ORAL | Status: DC

## 2015-01-19 MED ORDER — DILTIAZEM HCL ER 120 MG PO CP24: 120.0000 mg | ORAL_CAPSULE | Freq: Every day | ORAL | Status: DC

## 2015-01-19 NOTE — Telephone Encounter (Signed)
30 day refill sent in, OV scheduled for 01/27/15. Please order labs for pt to come in before visit to have drawn

## 2015-01-20 ENCOUNTER — Other Ambulatory Visit: Payer: Self-pay | Admitting: Internal Medicine

## 2015-01-20 DIAGNOSIS — I10 Essential (primary) hypertension: Secondary | ICD-10-CM

## 2015-01-20 DIAGNOSIS — E782 Mixed hyperlipidemia: Secondary | ICD-10-CM

## 2015-01-20 DIAGNOSIS — R739 Hyperglycemia, unspecified: Secondary | ICD-10-CM | POA: Insufficient documentation

## 2015-01-20 DIAGNOSIS — K219 Gastro-esophageal reflux disease without esophagitis: Secondary | ICD-10-CM

## 2015-01-20 NOTE — Telephone Encounter (Signed)
Orders entered

## 2015-01-27 ENCOUNTER — Encounter: Payer: Self-pay | Admitting: Internal Medicine

## 2015-01-27 ENCOUNTER — Ambulatory Visit (INDEPENDENT_AMBULATORY_CARE_PROVIDER_SITE_OTHER): Payer: BLUE CROSS/BLUE SHIELD | Admitting: Internal Medicine

## 2015-01-27 VITALS — BP 136/88 | HR 67 | Temp 98.1°F | Resp 16 | Ht 71.0 in | Wt 259.0 lb

## 2015-01-27 DIAGNOSIS — R05 Cough: Secondary | ICD-10-CM

## 2015-01-27 DIAGNOSIS — I10 Essential (primary) hypertension: Secondary | ICD-10-CM | POA: Diagnosis not present

## 2015-01-27 DIAGNOSIS — K219 Gastro-esophageal reflux disease without esophagitis: Secondary | ICD-10-CM

## 2015-01-27 DIAGNOSIS — Z0000 Encounter for general adult medical examination without abnormal findings: Secondary | ICD-10-CM

## 2015-01-27 DIAGNOSIS — R059 Cough, unspecified: Secondary | ICD-10-CM

## 2015-01-27 MED ORDER — RANITIDINE HCL 150 MG PO TABS: 150.0000 mg | ORAL_TABLET | Freq: Two times a day (BID) | ORAL | Status: AC

## 2015-01-27 MED ORDER — MONTELUKAST SODIUM 10 MG PO TABS: 10.0000 mg | ORAL_TABLET | Freq: Every day | ORAL | Status: DC

## 2015-01-27 MED ORDER — DILTIAZEM HCL ER 120 MG PO CP24: 120.0000 mg | ORAL_CAPSULE | Freq: Every day | ORAL | Status: DC

## 2015-01-27 MED ORDER — LOSARTAN POTASSIUM 100 MG PO TABS: 100.0000 mg | ORAL_TABLET | Freq: Every day | ORAL | Status: DC

## 2015-01-27 NOTE — Progress Notes (Signed)
Pre visit review using our clinic review tool, if applicable. No additional management support is needed unless otherwise documented below in the visit note. 

## 2015-01-27 NOTE — Progress Notes (Signed)
   Subjective:    Patient ID: Dustin Wang, male    DOB: [birth year], 49 y.o.   MRN: [id number]  HPI The patient is here for a physical to assess status of active health conditions.  PMH, FH, & Social History reviewed & updated. FH as recorded updated.  He does eat increased amounts of fried foods. He restricts red meat and does not add salt to his food. He rarely drinks alcohol. He's never smoked. He has no cardio vascular exercise program. Blood pressure averages 120/80.  He's been compliant with his medications without adverse effects  Positive review of systems include intermittent plantar fasciitis.  He has nocturia 2.  Review of Systems  Chest pain, palpitations, tachycardia, exertional dyspnea, paroxysmal nocturnal dyspnea, claudication or edema are absent. No unexplained weight loss, abdominal pain, significant dyspepsia, dysphagia, melena, rectal bleeding, or persistently small caliber stools. Dysuria, pyuria, hematuria, frequency, nocturia or polyuria are denied. Change in hair, skin, nails denied. No bowel changes of constipation or diarrhea. No intolerance to heat or cold.     Objective:   Physical Exam  Pertinent or positive findings include: Ptosis is present bilaterally. Abdomen is protuberant. He has crepitus in the knees. There is a small varices in the left scrotum. Prostate is normal without enlargement.  General appearance :adequately nourished; in no distress.  Eyes: No conjunctival inflammation or scleral icterus is present.  Oral exam:  Lips and gums are healthy appearing.There is no oropharyngeal erythema or exudate noted. Dental hygiene is good.  Heart:  Normal rate and regular rhythm. S1 and S2 normal without gallop, murmur, click, rub or other extra sounds    Lungs:Chest clear to auscultation; no wheezes, rhonchi,rales ,or rubs present.No increased work of breathing.   Abdomen: bowel sounds normal, soft and non-tender without masses, organomegaly or  hernias noted.  No guarding or rebound. No flank tenderness to percussion.  Vascular : all pulses equal ; no bruits present.  Skin:Warm & dry.  Intact without suspicious lesions or rashes ; no tenting or jaundice   Lymphatic: No lymphadenopathy is noted about the head, neck, axilla, or inguinal areas.   Neuro: Strength, tone & DTRs normal.     Assessment & Plan:  #1 comprehensive physical exam; no acute findings  Plan: see Orders  & Recommendations

## 2015-01-27 NOTE — Patient Instructions (Addendum)
Minimal Blood Pressure Goal= AVERAGE < 140/90;  Ideal is an AVERAGE < 135/85. This AVERAGE should be calculated from @ least 5-7 BP readings taken @ different times of day on different days of week. You should not respond to isolated BP readings , but rather the AVERAGE for that week .Please bring your  blood pressure cuff to office visits to verify that it is reliable.It  can also be checked against the blood pressure device at the pharmacy. Finger or wrist cuffs are not dependable; an arm cuff is.  Reflux of gastric acid may be asymptomatic as this may occur mainly during sleep.The triggers for reflux  include stress; the "aspirin family" ; alcohol; peppermint; and caffeine (coffee, tea, cola, and chocolate). The aspirin family would include aspirin and the nonsteroidal agents such as ibuprofen &  Naproxen. Tylenol would not cause reflux. If having symptoms ; food & drink should be avoided for @ least 2 hours before going to bed.  Your next office appointment will be determined based upon review of your pending labs  and  xrays  Those written interpretation of the lab results and instructions will be transmitted to you by My Chart  Critical results will be called.   Followup as needed for any active or acute issue. Please report any significant change in your symptoms. 

## 2015-01-28 ENCOUNTER — Other Ambulatory Visit (INDEPENDENT_AMBULATORY_CARE_PROVIDER_SITE_OTHER): Payer: BLUE CROSS/BLUE SHIELD

## 2015-01-28 DIAGNOSIS — K219 Gastro-esophageal reflux disease without esophagitis: Secondary | ICD-10-CM | POA: Diagnosis not present

## 2015-01-28 DIAGNOSIS — I10 Essential (primary) hypertension: Secondary | ICD-10-CM | POA: Diagnosis not present

## 2015-01-28 DIAGNOSIS — E782 Mixed hyperlipidemia: Secondary | ICD-10-CM

## 2015-01-28 DIAGNOSIS — R739 Hyperglycemia, unspecified: Secondary | ICD-10-CM | POA: Diagnosis not present

## 2015-01-28 LAB — HEPATIC FUNCTION PANEL
ALT: 106 U/L — ABNORMAL HIGH (ref 0–53)
AST: 43 U/L — ABNORMAL HIGH (ref 0–37)
Albumin: 4.1 g/dL (ref 3.5–5.2)
Alkaline Phosphatase: 82 U/L (ref 39–117)
BILIRUBIN DIRECT: 0.1 mg/dL (ref 0.0–0.3)
BILIRUBIN TOTAL: 0.6 mg/dL (ref 0.2–1.2)
Total Protein: 6.9 g/dL (ref 6.0–8.3)

## 2015-01-28 LAB — LIPID PANEL
CHOL/HDL RATIO: 5
Cholesterol: 130 mg/dL (ref 0–200)
HDL: 25.8 mg/dL — AB (ref >39.00)
NonHDL: 104.17
TRIGLYCERIDES: 229 mg/dL — AB (ref 0.0–149.0)
VLDL: 45.8 mg/dL — AB (ref 0.0–40.0)

## 2015-01-28 LAB — CBC WITH DIFFERENTIAL/PLATELET
BASOS ABS: 0.1 10*3/uL (ref 0.0–0.1)
BASOS PCT: 0.8 % (ref 0.0–3.0)
EOS ABS: 0.1 10*3/uL (ref 0.0–0.7)
Eosinophils Relative: 1.5 % (ref 0.0–5.0)
HCT: 49.8 % (ref 39.0–52.0)
HEMOGLOBIN: 17.6 g/dL — AB (ref 13.0–17.0)
Lymphocytes Relative: 26 % (ref 12.0–46.0)
Lymphs Abs: 2.2 10*3/uL (ref 0.7–4.0)
MCHC: 35.2 g/dL (ref 30.0–36.0)
MCV: 91.8 fl (ref 78.0–100.0)
MONO ABS: 0.9 10*3/uL (ref 0.1–1.0)
Monocytes Relative: 10.7 % (ref 3.0–12.0)
NEUTROS ABS: 5.2 10*3/uL (ref 1.4–7.7)
Neutrophils Relative %: 61 % (ref 43.0–77.0)
PLATELETS: 196 10*3/uL (ref 150.0–400.0)
RBC: 5.43 Mil/uL (ref 4.22–5.81)
RDW: 12.2 % (ref 11.5–15.5)
WBC: 8.5 10*3/uL (ref 4.0–10.5)

## 2015-01-28 LAB — BASIC METABOLIC PANEL
BUN: 11 mg/dL (ref 6–23)
CALCIUM: 9 mg/dL (ref 8.4–10.5)
CHLORIDE: 103 meq/L (ref 96–112)
CO2: 24 mEq/L (ref 19–32)
CREATININE: 0.97 mg/dL (ref 0.40–1.50)
GFR: 88.13 mL/min (ref >60.00)
Glucose, Bld: 133 mg/dL — ABNORMAL HIGH (ref 70–99)
Potassium: 3.8 mEq/L (ref 3.5–5.1)
Sodium: 136 mEq/L (ref 135–145)

## 2015-01-28 LAB — LDL CHOLESTEROL, DIRECT: Direct LDL: 73 mg/dL

## 2015-01-28 LAB — HEMOGLOBIN A1C: Hgb A1c MFr Bld: 6 % (ref 4.6–6.5)

## 2015-01-28 LAB — TSH: TSH: 0.96 u[IU]/mL (ref 0.35–4.50)

## 2015-01-29 ENCOUNTER — Other Ambulatory Visit: Payer: Self-pay | Admitting: Internal Medicine

## 2015-01-29 DIAGNOSIS — R7401 Elevation of levels of liver transaminase levels: Secondary | ICD-10-CM

## 2015-01-29 DIAGNOSIS — R740 Nonspecific elevation of levels of transaminase and lactic acid dehydrogenase [LDH]: Principal | ICD-10-CM

## 2015-01-29 DIAGNOSIS — E8881 Metabolic syndrome: Secondary | ICD-10-CM

## 2015-02-13 ENCOUNTER — Other Ambulatory Visit: Payer: Self-pay | Admitting: Internal Medicine

## 2015-02-17 ENCOUNTER — Telehealth: Payer: Self-pay | Admitting: Internal Medicine

## 2015-02-17 ENCOUNTER — Other Ambulatory Visit: Payer: Self-pay | Admitting: Internal Medicine

## 2015-02-17 NOTE — Telephone Encounter (Signed)
[facility] called and said that they never received the losartan (COZAAR) 100 MG tablet [[id number]][[id number]] Can it be resent?

## 2015-02-18 ENCOUNTER — Other Ambulatory Visit: Payer: Self-pay | Admitting: Emergency Medicine

## 2015-02-18 DIAGNOSIS — I10 Essential (primary) hypertension: Secondary | ICD-10-CM

## 2015-02-18 MED ORDER — LOSARTAN POTASSIUM 100 MG PO TABS
100.0000 mg | ORAL_TABLET | Freq: Every day | ORAL | Status: DC
Start: 2015-02-18 — End: 2015-02-18

## 2015-02-18 MED ORDER — DILTIAZEM HCL ER 120 MG PO CP24: 120.0000 mg | ORAL_CAPSULE | Freq: Every day | ORAL | Status: DC

## 2015-02-18 MED ORDER — LOSARTAN POTASSIUM 100 MG PO TABS: 100.0000 mg | ORAL_TABLET | Freq: Every day | ORAL | Status: DC

## 2015-02-18 NOTE — Telephone Encounter (Signed)
Resent electronically to [facility]...Raechel Chute/lmb

## 2015-06-08 ENCOUNTER — Encounter: Payer: Self-pay | Admitting: Family Medicine

## 2015-06-08 ENCOUNTER — Ambulatory Visit (INDEPENDENT_AMBULATORY_CARE_PROVIDER_SITE_OTHER): Payer: Managed Care, Other (non HMO) | Admitting: Family Medicine

## 2015-06-08 VITALS — BP 120/78 | HR 98 | Temp 98.5°F | Wt 259.6 lb

## 2015-06-08 DIAGNOSIS — B9789 Other viral agents as the cause of diseases classified elsewhere: Principal | ICD-10-CM

## 2015-06-08 DIAGNOSIS — J069 Acute upper respiratory infection, unspecified: Secondary | ICD-10-CM

## 2015-06-08 DIAGNOSIS — Z8709 Personal history of other diseases of the respiratory system: Secondary | ICD-10-CM

## 2015-06-08 LAB — POCT INFLUENZA A/B
INFLUENZA B, POC: NEGATIVE
Influenza A, POC: NEGATIVE

## 2015-06-08 MED ORDER — BUDESONIDE-FORMOTEROL FUMARATE 160-4.5 MCG/ACT IN AERO: 2.0000 | INHALATION_SPRAY | Freq: Two times a day (BID) | RESPIRATORY_TRACT | Status: DC | PRN

## 2015-06-08 NOTE — Addendum Note (Signed)
Addended by: Griselda Miner E on: 06/08/2015 12:52 PM   Modules accepted: Orders

## 2015-06-08 NOTE — Progress Notes (Signed)
Subjective:    Patient ID: Dustin Wang, male    DOB: June 08, 1965, 50 y.o.   MRN: [id number]  HPI  Dustin Wang is a 50 year old male who presents today with a 6 day history of "cold symptoms" after returning from a business trip. Symptoms have included "scratchy throat", sneezing, nonproductive cough, and fatigue. Last night, he reports eating fast food from [employer] which caused reflux followed by nausea and vomiting. He reports that nausea and vomiting have improved and not reoccurred after this episode. Associated symptoms of sinus pressure with fatigue related to extended work hours, stress, and traveling reported by patient. Tylenol has been used at home which has provided moderate benefit. He also notes increasing his water intake with 2 Gatorades after vomiting episode. He has been able to keep all fluids down without vomiting.    Pertinent history includes reactive airway disease. Symbicort has been used in the past successfully if needed.  Review of Systems  Constitutional: Positive for fatigue. Negative for chills.  HENT: Positive for congestion, postnasal drip, rhinorrhea, sinus pressure and sneezing.        Ear pressure   Respiratory: Positive for cough. Negative for chest tightness and wheezing.        SOB can be noted with coughing. This is not present at this visit and patient reports that this has improved since symptoms started.  Cardiovascular: Negative for chest pain, palpitations and leg swelling.  Gastrointestinal: Negative for nausea, abdominal pain and diarrhea.       Vomiting has resolved but was present after eating fast food dinner.   Genitourinary: Negative for dysuria and flank pain.  Musculoskeletal: Negative for myalgias and arthralgias.  Skin: Negative for pallor.  Neurological: Negative for dizziness, light-headedness and headaches.   Past Medical History  Diagnosis Date  . Hyperlipidemia   . Hypertension   . 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 Marital Status: Married    Spouse Name: N/A  . Number of Children: N/A  . Years of Education: N/A   Occupational History  . Sales Manager [employer]   Social History Main Topics  . Smoking status: Never Smoker   . Smokeless tobacco: Not on file  . Alcohol Use: Yes     Comment: rarely  . Drug Use: No  . Sexual Activity: Not on file   Other Topics Concern  . Not on file   Social History Narrative    Past Surgical History  Procedure Laterality Date  . Mouth surgery      Family History  Problem Relation Age of Onset  . Hypertension Mother   . Chronic bronchitis Mother   . Hypertension Father   . Stroke Maternal Grandmother   . Prostate cancer Maternal Grandfather     In his 39s  . Diabetes Mother     Diet-controlled    Allergies  Allergen Reactions  . Penicillins     Rash Medi Alert bracelet  is recommended    Current Outpatient Prescriptions on File Prior to Visit  Medication Sig Dispense Refill  . b complex vitamins tablet Take 1 tablet by mouth daily. Reported on 06/08/2015    . cholecalciferol (VITAMIN D) 1000 UNITS tablet Take 1,000 Units by mouth daily. Reported on 06/08/2015    . diltiazem (DILACOR XR) 120 MG 24 hr capsule Take 1 capsule (120 mg total) by mouth daily. 90  capsule 3  . Fish Oil OIL by Does not apply route daily.      Marland Kitchen losartan (COZAAR) 100 MG tablet Take 1 tablet (100 mg total) by mouth daily. 90 tablet 3  . montelukast (SINGULAIR) 10 MG tablet Take 1 tablet (10 mg total) by mouth at bedtime. 90 tablet 1  . ranitidine (ZANTAC) 150 MG tablet Take 1 tablet (150 mg total) by mouth 2 (two) times daily. ICD K 21 9 180 tablet 3   No current facility-administered medications on file prior to visit.    BP 120/78 mmHg  Pulse 98  Temp(Src) 98.5 F (36.9 C) (Oral)  Wt 259 lb 9.6 oz (117.754 kg)  SpO2 97%       Objective:   Physical Exam  Constitutional: He is  oriented to person, place, and time. He appears well-developed and well-nourished.  HENT:  Right Ear: Tympanic membrane normal.  Left Ear: Tympanic membrane normal.  Nose: Rhinorrhea present. Right sinus exhibits no maxillary sinus tenderness and no frontal sinus tenderness. Left sinus exhibits no maxillary sinus tenderness and no frontal sinus tenderness.  Mucous membranes moist  Eyes: Pupils are equal, round, and reactive to light.  Cardiovascular: Normal rate, regular rhythm and intact distal pulses.   Pulmonary/Chest: Effort normal and breath sounds normal. He has no wheezes. He has no rales.  Abdominal: Soft. Bowel sounds are normal. He exhibits no distension. There is no tenderness.  Lymphadenopathy:    He has no cervical adenopathy.  Neurological: He is alert and oriented to person, place, and time.  Skin: Skin is warm and dry. No erythema.  Psychiatric: He has a normal mood and affect.       Assessment & Plan:  1. Viral URI with cough  Continue supportive measures of increasing fluids, rest, and resume regular diet as tolerated. Advised RTC if symptoms do not improve in 3-4 days, worsen, or fever of >101 develops.  2. History of reactive airway disease Patient requested a refill of symbicort to use if needed due to history of reactive airway disease. Advised patient to use inhaler as needed and follow up for further evaluation if he develops symptoms of SOB, wheezing, and cough that persists. Patient noted that he will be moving to [facility] within the next 2 months. Advised patient to find new PCP and follow up with current PCP if any new symptoms appear, or current viral symptoms do not improve. He agreed with plan and voiced understanding.  - budesonide-formoterol (SYMBICORT) 160-4.5 MCG/ACT inhaler; Inhale 2 puffs into the lungs every 12 (twelve) hours as needed. Gargle and spit after use  Dispense: 1 Inhaler; Refill: 1

## 2015-06-08 NOTE — Patient Instructions (Signed)
Continue supportive measures as directed below. Avoid fast food choices however you may resume a regular diet as tolerated. Important to drink enough water so that urine is pale yellow or clear. Use inhaler for reactive airway symptoms as needed and follow up for further evaluation if symptoms do not improve in 3-4 days, worsen, or you develop a fever >101.   Upper Respiratory Infection, Adult Most upper respiratory infections (URIs) are a viral infection of the air passages leading to the lungs. A URI affects the nose, throat, and upper air passages. The most common type of URI is nasopharyngitis and is typically referred to as "the common cold." URIs run their course and usually go away on their own. Most of the time, a URI does not require medical attention, but sometimes a bacterial infection in the upper airways can follow a viral infection. This is called a secondary infection. Sinus and middle ear infections are common types of secondary upper respiratory infections. Bacterial pneumonia can also complicate a URI. A URI can worsen asthma and chronic obstructive pulmonary disease (COPD). Sometimes, these complications can require emergency medical care and may be life threatening.  CAUSES Almost all URIs are caused by viruses. A virus is a type of germ and can spread from one person to another.  RISKS FACTORS You may be at risk for a URI if:   You smoke.   You have chronic heart or lung disease.  You have a weakened defense (immune) system.   You are very young or very old.   You have nasal allergies or asthma.  You work in crowded or poorly ventilated areas.  You work in health care facilities or schools. SIGNS AND SYMPTOMS  Symptoms typically develop 2-3 days after you come in contact with a cold virus. Most viral URIs last 7-10 days. However, viral URIs from the influenza virus (flu virus) can last 14-18 days and are typically more severe. Symptoms may include:   Runny or stuffy  (congested) nose.   Sneezing.   Cough.   Sore throat.   Headache.   Fatigue.   Fever.   Loss of appetite.   Pain in your forehead, behind your eyes, and over your cheekbones (sinus pain).  Muscle aches.  DIAGNOSIS  Your health care provider may diagnose a URI by:  Physical exam.  Tests to check that your symptoms are not due to another condition such as:  Strep throat.  Sinusitis.  Pneumonia.  Asthma. TREATMENT  A URI goes away on its own with time. It cannot be cured with medicines, but medicines may be prescribed or recommended to relieve symptoms. Medicines may help:  Reduce your fever.  Reduce your cough.  Relieve nasal congestion. HOME CARE INSTRUCTIONS   Take medicines only as directed by your health care provider.   Gargle warm saltwater or take cough drops to comfort your throat as directed by your health care provider.  Use a warm mist humidifier or inhale steam from a shower to increase air moisture. This may make it easier to breathe.  Drink enough fluid to keep your urine clear or pale yellow.   Eat soups and other clear broths and maintain good nutrition.   Rest as needed.   Return to work when your temperature has returned to normal or as your health care provider advises. You may need to stay home longer to avoid infecting others. You can also use a face mask and careful hand washing to prevent spread of the virus.  Increase the usage of your inhaler if you have asthma.   Do not use any tobacco products, including cigarettes, chewing tobacco, or electronic cigarettes. If you need help quitting, ask your health care provider. PREVENTION  The best way to protect yourself from getting a cold is to practice good hygiene.   Avoid oral or hand contact with people with cold symptoms.   Wash your hands often if contact occurs.  There is no clear evidence that vitamin C, vitamin E, echinacea, or exercise reduces the chance of  developing a cold. However, it is always recommended to get plenty of rest, exercise, and practice good nutrition.  SEEK MEDICAL CARE IF:   You are getting worse rather than better.   Your symptoms are not controlled by medicine.   You have chills.  You have worsening shortness of breath.  You have brown or red mucus.  You have yellow or brown nasal discharge.  You have pain in your face, especially when you bend forward.  You have a fever.  You have swollen neck glands.  You have pain while swallowing.  You have white areas in the back of your throat. SEEK IMMEDIATE MEDICAL CARE IF:   You have severe or persistent:  Headache.  Ear pain.  Sinus pain.  Chest pain.  You have chronic lung disease and any of the following:  Wheezing.  Prolonged cough.  Coughing up blood.  A change in your usual mucus.  You have a stiff neck.  You have changes in your:  Vision.  Hearing.  Thinking.  Mood. MAKE SURE YOU:   Understand these instructions.  Will watch your condition.  Will get help right away if you are not doing well or get worse.   This information is not intended to replace advice given to you by your health care provider. Make sure you discuss any questions you have with your health care provider.   Document Released: 10/05/2000 Document Revised: 08/26/2014 Document Reviewed: 07/17/2013 Elsevier Interactive Patient Education [employer].

## 2015-06-08 NOTE — Progress Notes (Signed)
Pre visit review using our clinic review tool, if applicable. No additional management support is needed unless otherwise documented below in the visit note. 

## 2015-07-27 ENCOUNTER — Other Ambulatory Visit: Payer: Self-pay | Admitting: Internal Medicine

## 2015-07-28 NOTE — Telephone Encounter (Signed)
Left message advising patient that he needs to call back to schedule appt/get established with new pcp----let tamara know when appt is scheduled so that singulair rx request can be sent to patient's pharm

## 2015-08-17 NOTE — Telephone Encounter (Signed)
Pt has an appt with Tammy SoursGreg on 09/30/15. Please send in this med.

## 2015-09-30 ENCOUNTER — Ambulatory Visit: Payer: Managed Care, Other (non HMO) | Admitting: Family

## 2015-10-15 ENCOUNTER — Encounter: Payer: Self-pay | Admitting: Family

## 2015-10-15 ENCOUNTER — Ambulatory Visit (INDEPENDENT_AMBULATORY_CARE_PROVIDER_SITE_OTHER): Payer: Managed Care, Other (non HMO) | Admitting: Family

## 2015-10-15 VITALS — BP 122/80 | HR 77 | Temp 98.2°F | Resp 16 | Ht 71.0 in | Wt 247.8 lb

## 2015-10-15 DIAGNOSIS — I10 Essential (primary) hypertension: Secondary | ICD-10-CM

## 2015-10-15 DIAGNOSIS — J302 Other seasonal allergic rhinitis: Secondary | ICD-10-CM

## 2015-10-15 MED ORDER — LOSARTAN POTASSIUM 100 MG PO TABS: 100.0000 mg | ORAL_TABLET | Freq: Every day | ORAL | Status: DC

## 2015-10-15 MED ORDER — DILTIAZEM HCL ER 120 MG PO CP24: 120.0000 mg | ORAL_CAPSULE | Freq: Every day | ORAL | Status: DC

## 2015-10-15 MED ORDER — MONTELUKAST SODIUM 10 MG PO TABS: ORAL_TABLET | ORAL | Status: DC

## 2015-10-15 NOTE — Assessment & Plan Note (Signed)
Hypertension is adequately controlled and below goal 140/90 with current regimen and no adverse side effects. No symptoms of end organ damage or worse headache of life. Continue current dosage of diltiazem and losartan. Continue to monitor blood pressure at home and encouraged weight loss and decreasing sodium in diet. Follow-up with new PCP or as needed.

## 2015-10-15 NOTE — Assessment & Plan Note (Signed)
Seasonal allergies appear adequately controlled with current regimen with no adverse side effects or recent exacerbations. Continue current dosage of montelukast. Follow-up if symptoms are no longer controlled or sooner if needed.

## 2015-10-15 NOTE — Progress Notes (Signed)
Pre visit review using our clinic review tool, if applicable. No additional management support is needed unless otherwise documented below in the visit note. 

## 2015-10-15 NOTE — Progress Notes (Signed)
Subjective:    Patient ID: Dustin Wang, male    DOB: May 04, 1965, 50 y.o.   MRN: [id number]  Chief Complaint  Patient presents with  . Medication Refill    needs refills on all medication, moving to [place] today and needs meds to get him through until he gets a new dr.    HPI:  Dustin GrammesWilliam D Umana is a 50 y.o. male who  has a past medical history of Hyperlipidemia; Hypertension; and RAD (reactive airway disease). and presents today for a follow up office visit.   1.) Hypertension - Currently maintained on diltiazem and losartan. Reports taking the medication as prescribed and denies adverse side effects. No symptoms of end organ damage or worse headache of life. Occasionally checks blood pressure at home. Working on improving his nutrition and physical.   BP Readings from Last 3 Encounters:  10/15/15 122/80  06/08/15 120/78  01/27/15 136/88    2.) Seasonal allergies - Currently maintained on montelukast. Reports taking the medication as prescribed and denies adverse side effects. She is actually generally well controlled with medication regimen. No major exacerbations.    Allergies  Allergen Reactions  . Penicillins     Rash Medi Alert bracelet  is recommended     Current Outpatient Prescriptions on File Prior to Visit  Medication Sig Dispense Refill  . b complex vitamins tablet Take 1 tablet by mouth daily. Reported on 06/08/2015    . cholecalciferol (VITAMIN D) 1000 UNITS tablet Take 1,000 Units by mouth daily. Reported on 06/08/2015    . Fish Oil OIL by Does not apply route daily.      . ranitidine (ZANTAC) 150 MG tablet Take 1 tablet (150 mg total) by mouth 2 (two) times daily. ICD K 21 9 180 tablet 3   No current facility-administered medications on file prior to visit.    Review of Systems  Constitutional: Negative for fever and chills.  HENT: Negative for sneezing.   Eyes: Negative for itching.  Respiratory: Negative for chest tightness and shortness of breath.     Cardiovascular: Negative for chest pain, palpitations and leg swelling.      Objective:    BP 122/80 mmHg  Pulse 77  Temp(Src) 98.2 F (36.8 C) (Oral)  Resp 16  Ht 5\' 11"  (1.803 m)  Wt 247 lb 12.8 oz (112.401 kg)  BMI 34.58 kg/m2  SpO2 97% Nursing note and vital signs reviewed.  Physical Exam  Constitutional: He is oriented to person, place, and time. He appears well-developed and well-nourished. No distress.  Cardiovascular: Normal rate, regular rhythm, normal heart sounds and intact distal pulses.   Pulmonary/Chest: Effort normal and breath sounds normal.  Neurological: He is alert and oriented to person, place, and time.  Skin: Skin is warm and dry.  Psychiatric: He has a normal mood and affect. His behavior is normal. Judgment and thought content normal.       Assessment & Plan:   Problem List Items Addressed This Visit      Cardiovascular and Mediastinum   Essential hypertension - Primary    Hypertension is adequately controlled and below goal 140/90 with current regimen and no adverse side effects. No symptoms of end organ damage or worse headache of life. Continue current dosage of diltiazem and losartan. Continue to monitor blood pressure at home and encouraged weight loss and decreasing sodium in diet. Follow-up with new PCP or as needed.      Relevant Medications   losartan (COZAAR) 100 MG  tablet   diltiazem (DILACOR XR) 120 MG 24 hr capsule     Respiratory   Seasonal allergies    Seasonal allergies appear adequately controlled with current regimen with no adverse side effects or recent exacerbations. Continue current dosage of montelukast. Follow-up if symptoms are no longer controlled or sooner if needed.      Relevant Medications   montelukast (SINGULAIR) 10 MG tablet       I have discontinued Mr. Daughtry's budesonide-formoterol. I am also having him maintain his Fish Oil, b complex vitamins, cholecalciferol, ranitidine, losartan, montelukast, and  diltiazem.   Follow-up: Return if symptoms worsen or fail to improve.  Jeanine Luzalone, Lanessa Shill, FNP

## 2015-10-15 NOTE — Patient Instructions (Signed)
Thank you for choosing [facility].  Summary/Instructions:  Please continue to take your medication as prescribed.  Follow up with your new primary care provider when you establish in [place].  Best of luck and health in the future.  Your prescription(s) have been submitted to your pharmacy or been printed and provided for you. Please take as directed and contact our office if you believe you are having problem(s) with the medication(s) or have any questions.  If your symptoms worsen or fail to improve, please contact our office for further instruction, or in case of emergency go directly to the emergency room at the closest medical facility.

## 2016-01-16 ENCOUNTER — Other Ambulatory Visit: Payer: Self-pay | Admitting: Family

## 2016-01-16 DIAGNOSIS — I10 Essential (primary) hypertension: Secondary | ICD-10-CM

## 2016-02-12 ENCOUNTER — Other Ambulatory Visit: Payer: Self-pay | Admitting: Family

## 2016-02-12 DIAGNOSIS — J302 Other seasonal allergic rhinitis: Secondary | ICD-10-CM

## 2016-05-05 ENCOUNTER — Other Ambulatory Visit: Payer: Self-pay | Admitting: Internal Medicine

## 2016-11-26 ENCOUNTER — Other Ambulatory Visit: Payer: Self-pay | Admitting: Family

## 2016-11-28 NOTE — Telephone Encounter (Signed)
Needs office visit for additional refills - has not been seen in 1 year. 30 day supply provided with no refills.
